# Patient Record
Sex: Female | Born: 1960
Health system: Southern US, Community
[De-identification: ages and names within clinical notes are randomized; demographics above are authoritative.]

## PROBLEM LIST (undated history)

## (undated) DIAGNOSIS — E669 Obesity, unspecified: Secondary | ICD-10-CM

## (undated) DIAGNOSIS — K219 Gastro-esophageal reflux disease without esophagitis: Secondary | ICD-10-CM

## (undated) DIAGNOSIS — R87619 Unspecified abnormal cytological findings in specimens from cervix uteri: Secondary | ICD-10-CM

## (undated) DIAGNOSIS — R7301 Impaired fasting glucose: Secondary | ICD-10-CM

## (undated) DIAGNOSIS — R7303 Prediabetes: Secondary | ICD-10-CM

## (undated) DIAGNOSIS — I10 Essential (primary) hypertension: Secondary | ICD-10-CM

## (undated) HISTORY — DX: Gastro-esophageal reflux disease without esophagitis: K21.9

## (undated) HISTORY — DX: Unspecified abnormal cytological findings in specimens from cervix uteri: R87.619

## (undated) HISTORY — PX: APPENDECTOMY: SHX54

## (undated) HISTORY — DX: Obesity, unspecified: E66.9

## (undated) HISTORY — DX: Prediabetes: R73.03

## (undated) HISTORY — DX: Impaired fasting glucose: R73.01

## (undated) HISTORY — DX: Essential (primary) hypertension: I10

## (undated) HISTORY — PX: OTHER SURGICAL HISTORY: SHX169

---

## 2006-09-19 ENCOUNTER — Ambulatory Visit: Payer: Self-pay | Admitting: Family Medicine

## 2006-09-19 ENCOUNTER — Encounter: Payer: Self-pay | Admitting: Family Medicine

## 2006-09-19 DIAGNOSIS — I10 Essential (primary) hypertension: Secondary | ICD-10-CM | POA: Insufficient documentation

## 2006-09-19 DIAGNOSIS — K219 Gastro-esophageal reflux disease without esophagitis: Secondary | ICD-10-CM | POA: Insufficient documentation

## 2006-10-02 ENCOUNTER — Ambulatory Visit: Payer: Self-pay | Admitting: Family Medicine

## 2007-01-11 ENCOUNTER — Ambulatory Visit: Payer: Self-pay | Admitting: Family Medicine

## 2007-04-08 ENCOUNTER — Telehealth: Payer: Self-pay | Admitting: Family Medicine

## 2007-10-14 ENCOUNTER — Ambulatory Visit: Payer: Self-pay | Admitting: Family Medicine

## 2007-12-03 ENCOUNTER — Encounter: Payer: Self-pay | Admitting: Family Medicine

## 2007-12-03 LAB — CONVERTED CEMR LAB
Albumin: 4.6 g/dL (ref 3.5–5.2)
Alkaline Phosphatase: 75 units/L (ref 39–117)
BUN: 10 mg/dL (ref 6–23)
CO2: 24 meq/L (ref 19–32)
Calcium: 9.3 mg/dL (ref 8.4–10.5)
Chloride: 101 meq/L (ref 96–112)
Cholesterol: 199 mg/dL (ref 0–200)
Glucose, Bld: 111 mg/dL — ABNORMAL HIGH (ref 70–99)
HDL: 62 mg/dL (ref >39)
LDL Cholesterol: 121 mg/dL — ABNORMAL HIGH (ref 0–99)
Potassium: 4 meq/L (ref 3.5–5.3)
Sodium: 138 meq/L (ref 135–145)
Total Protein: 7.8 g/dL (ref 6.0–8.3)
Triglycerides: 82 mg/dL (ref <150)

## 2007-12-04 ENCOUNTER — Encounter: Payer: Self-pay | Admitting: Family Medicine

## 2007-12-24 ENCOUNTER — Ambulatory Visit: Payer: Self-pay | Admitting: Family Medicine

## 2008-03-18 ENCOUNTER — Ambulatory Visit: Payer: Self-pay | Admitting: Family Medicine

## 2008-03-18 ENCOUNTER — Encounter: Admission: RE | Admit: 2008-03-18 | Discharge: 2008-03-18 | Payer: Self-pay | Admitting: Family Medicine

## 2008-03-18 DIAGNOSIS — M5416 Radiculopathy, lumbar region: Secondary | ICD-10-CM | POA: Insufficient documentation

## 2008-03-18 DIAGNOSIS — R7301 Impaired fasting glucose: Secondary | ICD-10-CM | POA: Insufficient documentation

## 2009-01-15 ENCOUNTER — Telehealth: Payer: Self-pay | Admitting: Family Medicine

## 2009-01-19 ENCOUNTER — Ambulatory Visit: Payer: Self-pay | Admitting: Family Medicine

## 2009-02-02 ENCOUNTER — Encounter: Payer: Self-pay | Admitting: Family Medicine

## 2009-02-02 LAB — CONVERTED CEMR LAB
Albumin: 4.6 g/dL (ref 3.5–5.2)
Alkaline Phosphatase: 74 units/L (ref 39–117)
BUN: 15 mg/dL (ref 6–23)
Calcium: 9.7 mg/dL (ref 8.4–10.5)
Chloride: 99 meq/L (ref 96–112)
Glucose, Bld: 98 mg/dL (ref 70–99)
HDL: 58 mg/dL (ref >39)
LDL Cholesterol: 120 mg/dL — ABNORMAL HIGH (ref 0–99)
Potassium: 4.1 meq/L (ref 3.5–5.3)
Sodium: 139 meq/L (ref 135–145)
Total Protein: 7.7 g/dL (ref 6.0–8.3)
Triglycerides: 83 mg/dL (ref <150)

## 2009-02-03 ENCOUNTER — Encounter: Payer: Self-pay | Admitting: Family Medicine

## 2009-04-02 ENCOUNTER — Ambulatory Visit: Payer: Self-pay | Admitting: Family Medicine

## 2009-04-02 DIAGNOSIS — L919 Hypertrophic disorder of the skin, unspecified: Secondary | ICD-10-CM

## 2009-04-02 DIAGNOSIS — L909 Atrophic disorder of skin, unspecified: Secondary | ICD-10-CM | POA: Insufficient documentation

## 2009-10-07 ENCOUNTER — Telehealth (INDEPENDENT_AMBULATORY_CARE_PROVIDER_SITE_OTHER): Payer: Self-pay | Admitting: *Deleted

## 2009-10-13 ENCOUNTER — Ambulatory Visit: Payer: Self-pay | Admitting: Family Medicine

## 2009-12-03 ENCOUNTER — Telehealth: Payer: Self-pay | Admitting: Family Medicine

## 2010-07-05 ENCOUNTER — Ambulatory Visit: Payer: Self-pay | Admitting: Family Medicine

## 2010-09-09 ENCOUNTER — Encounter: Payer: Self-pay | Admitting: Family Medicine

## 2010-09-12 LAB — CONVERTED CEMR LAB
ALT: 15 units/L (ref 0–35)
CO2: 26 meq/L (ref 19–32)
Calcium: 8.7 mg/dL (ref 8.4–10.5)
Chloride: 105 meq/L (ref 96–112)
Cholesterol: 165 mg/dL (ref 0–200)
Creatinine, Ser: 0.73 mg/dL (ref 0.40–1.20)
Glucose, Bld: 91 mg/dL (ref 70–99)
Sodium: 139 meq/L (ref 135–145)
Total Bilirubin: 0.6 mg/dL (ref 0.3–1.2)
Total Protein: 6.6 g/dL (ref 6.0–8.3)
Triglycerides: 58 mg/dL (ref <150)

## 2010-09-21 ENCOUNTER — Encounter: Admission: RE | Admit: 2010-09-21 | Discharge: 2010-09-21 | Payer: Self-pay | Admitting: Family Medicine

## 2010-12-11 ENCOUNTER — Encounter: Payer: Self-pay | Admitting: Family Medicine

## 2010-12-20 NOTE — Progress Notes (Signed)
Summary: OTC cold med  Phone Note Call from Patient   Caller: Patient [phone] Summary of Call: Pt would like to which OTC cold med you recommend since she has HTN. Please advise.  Initial call taken by: Payton Spark CMA,  December 03, 2009 12:24 PM  Follow-up for Phone Call        She can take plain Mucinex + nasal afrin spray every 12 hrs.   Follow-up by: Seymour Bars DO,  December 03, 2009 12:26 PM     Appended Document: OTC cold med [facility] informing Pt of the above.

## 2010-12-20 NOTE — Assessment & Plan Note (Signed)
Summary: CPE w/o pap   Vital Signs:  Patient profile:   50 year old female Height:      65 inches Weight:      147 pounds BMI:     24.55 O2 Sat:      99 % on Room air Pulse rate:   82 / minute BP sitting:   148 / 87  (left arm) Cuff size:   regular  Vitals Entered By: Payton Spark CMA (July 05, 2010 1:08 PM)  O2 Flow:  Room air CC: CPE w/out pap   Primary Care Provider:  Seymour Bars DO  CC:  CPE w/out pap.  History of Present Illness: Amanda Andrews presents for CPE w/o pap smear.  She recently got separated from her husband.  She is taking her Norvasc in the evenings for HTN.  She is walking more, eating healthy and has lost 7 lbs.  She is due for a mammogram and fasting labs.  She has a fam hx of premature stroke.  She is not a smoker.  Denies CP or DOE.  Sees [facility] pap smears.  Denies fam hx of colon cancer.       Current Medications (verified): 1)  Norvasc 5 Mg Tabs (Amlodipine Besylate) .Marland Kitchen.. 1 Tab By Mouth Daily  Allergies (verified): No Known Drug Allergies  Past History:  Past Medical History: Reviewed history from 03/18/2008 and no changes required. HTN GERD obesity impaired fasting glucose  paps: [facility] G2P2  Family History: Reviewed history from 01/19/2009 and no changes required. Father HTN, ETOH mother died , stroke at 36, lung cancer at 21 2 brothers, HTN 1/2 sister healthy Grandparents/Aunts with DM M uncle- 99s.  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   with 2 kids.  Nonsmoker, + 2nd hand smoke.  Walks 30 min 2x a wk.  Wants to lose wt.  Moved here from [place] in 2005.  Review of Systems  The patient denies anorexia, fever, weight loss, weight gain, vision loss, decreased hearing, hoarseness, chest pain, syncope, dyspnea on exertion, peripheral edema, prolonged cough, headaches, hemoptysis, abdominal pain, melena, hematochezia, severe indigestion/heartburn, hematuria, incontinence, genital sores, muscle  weakness, suspicious skin lesions, transient blindness, difficulty walking, depression, unusual weight change, abnormal bleeding, enlarged lymph nodes, angioedema, breast masses, and testicular masses.    Physical Exam  General:  alert, well-developed, well-nourished, and well-hydrated.   Head:  normocephalic and atraumatic.   Eyes:  pupils equal, pupils round, and pupils reactive to light.   Ears:  EACs patent; TMs translucent and gray with good cone of light and bony landmarks.  Nose:  no nasal discharge.   Mouth:  good dentition and pharynx pink and moist.   Neck:  no masses.   Lungs:  Normal respiratory effort, chest expands symmetrically. Lungs are clear to auscultation, no crackles or wheezes. Heart:  Normal rate and regular rhythm. S1 and S2 normal without gallop, murmur, click, rub or other extra sounds. Abdomen:  Bowel sounds positive,abdomen soft and non-tender without masses, organomegaly or hernias noted. Pulses:  2+ radial and pedal pulses Extremities:  no LE edema Skin:  color normal and no suspicious lesions.   Cervical Nodes:  No lymphadenopathy noted Psych:  good eye contact, not anxious appearing, and not depressed appearing.     Impression & Recommendations:  Problem # 1:  HEALTHY ADULT FEMALE (ICD-V70.0) Keeping healthy checklist for women reviewed. BP high on Norvasc 5 mg/ day.  Add HCTZ 12.5 mg/ day.  Update mammogram and fasting labs. EKG done today, NSR at 76 bpm, normal axis, no ischemia. Colonoscoy due at 50.   Tetanus updated 2010. MVI, calcium with D, healthy diet and regular exercise recommended. f/u BP in 2 mos.  Complete Medication List: 1)  Norvasc 5 Mg Tabs (Amlodipine besylate) .Marland Kitchen.. 1 tab by mouth daily 2)  Hydrochlorothiazide 12.5 Mg Caps (Hydrochlorothiazide) .Marland Kitchen.. 1 capsule by mouth qam  Other Orders: T-Comprehensive Metabolic Panel [id number]) T-Lipid Profile ([id number]) T-Mammography Bilateral Screening (29528) EKG w/ Interpretation  (93000)  Patient Instructions: 1)  EKG normal. 2)  Update fasting labs and mammogram. 3)  Will call you w/ results. 4)  Add HCTZ to Norvasc for BP each day. 5)  Return for f/u HTN in 2 mos. Prescriptions: NORVASC 5 MG TABS (AMLODIPINE BESYLATE) 1 tab by mouth daily  #30 x 6   Entered and Authorized by:   Seymour Bars DO   Signed by:   Seymour Bars DO on 07/05/2010   Method used:   Electronically to        [facility]  [address] (retail)       [address]       [place], [place]  [address]       Ph: [phone]       Fax: [phone]   RxID:   [id number] HYDROCHLOROTHIAZIDE 12.5 MG CAPS (HYDROCHLOROTHIAZIDE) 1 capsule by mouth qAM  #30 x 2   Entered and Authorized by:   Seymour Bars DO   Signed by:   Seymour Bars DO on 07/05/2010   Method used:   Electronically to        [facility]  [address] (retail)       [address]       [place], [place]  [address]       Ph: [phone]       Fax: [phone]   RxID:   [id number]

## 2011-06-27 ENCOUNTER — Other Ambulatory Visit: Payer: Self-pay | Admitting: Family Medicine

## 2011-07-21 ENCOUNTER — Encounter: Payer: Self-pay | Admitting: Family Medicine

## 2011-07-24 ENCOUNTER — Encounter: Payer: Self-pay | Admitting: Family Medicine

## 2011-08-01 ENCOUNTER — Encounter: Payer: Self-pay | Admitting: Family Medicine

## 2011-08-01 DIAGNOSIS — Z0289 Encounter for other administrative examinations: Secondary | ICD-10-CM

## 2011-08-09 ENCOUNTER — Telehealth: Payer: Self-pay | Admitting: Family Medicine

## 2011-08-09 MED ORDER — HYDROCHLOROTHIAZIDE 12.5 MG PO CAPS: 12.5000 mg | ORAL_CAPSULE | Freq: Every day | ORAL | Status: DC

## 2011-08-09 MED ORDER — AMLODIPINE BESYLATE 5 MG PO TABS: 5.0000 mg | ORAL_TABLET | Freq: Every day | ORAL | Status: DC

## 2011-08-09 NOTE — Telephone Encounter (Signed)
Patient called left a voice mail she needs high blood pressure meds called into [facility]. Pt states she doesn't have any refills left. Patient request a call back today please

## 2011-08-12 ENCOUNTER — Encounter: Payer: Self-pay | Admitting: Emergency Medicine

## 2011-08-12 ENCOUNTER — Inpatient Hospital Stay (INDEPENDENT_AMBULATORY_CARE_PROVIDER_SITE_OTHER)
Admission: RE | Admit: 2011-08-12 | Discharge: 2011-08-12 | Disposition: A | Discharge status: Home / Self Care | Payer: Self-pay | Source: Ambulatory Visit | Attending: Emergency Medicine | Admitting: Emergency Medicine

## 2011-08-12 DIAGNOSIS — J069 Acute upper respiratory infection, unspecified: Secondary | ICD-10-CM

## 2011-08-12 LAB — CONVERTED CEMR LAB: Rapid Strep: NEGATIVE

## 2011-08-15 ENCOUNTER — Telehealth: Payer: Self-pay | Admitting: Family Medicine

## 2011-08-15 NOTE — Telephone Encounter (Signed)
[facility] sent medication adherence therapy advisory notice.   Wanted correspondence to know if pt still taking amlodipine besylate 5 mg.   Plan  Reviewed.  Patient is still current with the script and [facility] notified last refill sent on 08-09-11. Jarvis Newcomer, LPN [facility]

## 2011-09-14 ENCOUNTER — Other Ambulatory Visit: Payer: Self-pay | Admitting: Family Medicine

## 2011-10-23 ENCOUNTER — Other Ambulatory Visit: Payer: Self-pay | Admitting: Family Medicine

## 2011-10-23 NOTE — Progress Notes (Signed)
Summary: cold?/TM   Vital Signs:  Patient Profile:   50 Years Old Female CC:      sore throat, rash on chest Height:     65 inches Weight:      148 pounds O2 Sat:      98 % O2 treatment:    Room Air Temp:     98.7 degrees F oral Pulse rate:   80 / minute Resp:     16 per minute BP sitting:   152 / 97  (left arm) Cuff size:   large  Vitals Entered By: Linton Flemings RN (August 12, 2011 1:32 PM)                  Updated Prior Medication List: NORVASC 5 MG TABS (AMLODIPINE BESYLATE) 1 tab by mouth daily  Current Allergies (reviewed today): No known allergies History of Present Illness Chief Complaint: sore throat, rash on chest History of Present Illness: 50 Years Old Female complains of onset of cold symptoms for 2 weeks.  Amanda Andrews has been using Alkaseltzer+ which is helping a little bit. +/- sore throat No cough No pleuritic pain No wheezing + nasal congestion + post-nasal drainage No sinus pain/pressure No chest congestion No itchy/red eyes No earache No hemoptysis No SOB No chills/sweats No fever No nausea No vomiting + mild abdominal pain No diarrhea + skin rashes (front of chest) + fatigue No myalgias No headache   REVIEW OF SYSTEMS Constitutional Symptoms       Complains of fever and fatigue.     Denies chills, night sweats, weight loss, and weight gain.  Eyes       Complains of glasses.      Denies change in vision, eye pain, eye discharge, contact lenses, and eye surgery. Ear/Nose/Throat/Mouth       Complains of sore throat.      Denies hearing loss/aids, change in hearing, ear pain, ear discharge, dizziness, frequent runny nose, frequent nose bleeds, sinus problems, hoarseness, and tooth pain or bleeding.  Respiratory       Denies dry cough, productive cough, wheezing, shortness of breath, asthma, bronchitis, and emphysema/COPD.  Cardiovascular       Complains of chest pain and tires easily with exhertion.      Denies murmurs.     Gastrointestinal       Complains of stomach pain.      Denies nausea/vomiting, diarrhea, constipation, blood in bowel movements, and indigestion. Genitourniary       Denies painful urination, kidney stones, and loss of urinary control. Neurological       Complains of headaches.      Denies paralysis, seizures, and fainting/blackouts. Musculoskeletal       Complains of muscle pain and joint pain.      Denies joint stiffness, decreased range of motion, redness, swelling, muscle weakness, and gout.  Skin       Denies bruising, unusual mles/lumps or sores, and hair/skin or nail changes.  Psych       Denies mood changes, temper/anger issues, anxiety/stress, speech problems, depression, and sleep problems. Other Comments: states she did not take BP meds this morning. sore throat n congestion x2 weeks.   Past History:  Past Medical History: Reviewed history from 03/18/2008 and no changes required. HTN GERD obesity impaired fasting glucose  paps: [facility] G2P2  Past Surgical History: Reviewed history from 09/19/2006 and no changes required. appendectomy Removal of cysts on fallopian tube  Family History: Reviewed history from 01/19/2009  and no changes required. Father HTN, ETOH mother died , stroke at 36, lung cancer at 82 2 brothers, HTN 1/2 sister healthy Grandparents/Aunts with DM M uncle- 80s.  Social History: Reviewed history from 07/05/2010 and no changes required. 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   with 2 kids.  Nonsmoker, + 2nd hand smoke.  Walks 30 min 2x a wk.  Wants to lose wt.  Moved here from [place] in 2005. Physical Exam General appearance: well developed, well nourished, no acute distress Ears: normal, no lesions or deformities Nasal: mucosa pink, nonedematous, no septal deviation, turbinates normal Oral/Pharynx: tongue normal, posterior pharynx without erythema or exudate Chest/Lungs: no rales, wheezes, or rhonchi bilateral, breath sounds equal  without effort Heart: regular rate and  rhythm, no murmur Abdomen: soft, non-tender without obvious organomegaly Skin: very slight raised erythema on anterior chest c/w atopic derm MSE: oriented to time, place, and person Assessment New Problems: UPPER RESPIRATORY INFECTION, ACUTE (ICD-465.9)   Plan New Orders: New Patient Level II [99202] T-Culture, Throat [[id number]] Rapid Strep [52841] Planning Comments:   1)  No ABX given today.  Rapid strep neg.  Culture pending.  This is likely viral.  For rash, would suggest Cortisone.  If worsening rash or new symptoms, should f/u with her PCP.   2)  Use nasal saline solution (over the counter) at least 3 times a day. 3)  Use over the counter decongestants like Zyrtec-D every 12 hours as needed to help with congestion. 4)  Can take tylenol every 6 hours or motrin every 8 hours for pain or fever. 5)  Follow up with your primary doctor  if no improvement in 5-7 days, sooner if increasing pain, fever, or new symptoms.    The patient and/or caregiver has been counseled thoroughly with regard to medications prescribed including dosage, schedule, interactions, rationale for use, and possible side effects and they verbalize understanding.  Diagnoses and expected course of recovery discussed and will return if not improved as expected or if the condition worsens. Patient and/or caregiver verbalized understanding.   Orders Added: 1)  New Patient Level II [99202] 2)  T-Culture, Throat [[id number]] 3)  Rapid Strep [53664]    Laboratory Results  Date/Time Received: August 12, 2011 1:48 PM  Date/Time Reported: August 12, 2011 1:48 PM   Other Tests  Rapid Strep: negative  Kit Test Internal QC: Negative   (Normal Range: Negative)

## 2011-12-04 ENCOUNTER — Other Ambulatory Visit: Payer: Self-pay | Admitting: Family Medicine

## 2012-02-09 ENCOUNTER — Ambulatory Visit (INDEPENDENT_AMBULATORY_CARE_PROVIDER_SITE_OTHER): Payer: BC Managed Care – PPO | Admitting: Family Medicine

## 2012-02-09 ENCOUNTER — Encounter: Payer: Self-pay | Admitting: Family Medicine

## 2012-02-09 VITALS — BP 152/85 | HR 98 | Ht 66.0 in | Wt 162.0 lb

## 2012-02-09 DIAGNOSIS — M25569 Pain in unspecified knee: Secondary | ICD-10-CM

## 2012-02-09 DIAGNOSIS — Z1231 Encounter for screening mammogram for malignant neoplasm of breast: Secondary | ICD-10-CM

## 2012-02-09 DIAGNOSIS — I10 Essential (primary) hypertension: Secondary | ICD-10-CM

## 2012-02-09 MED ORDER — LISINOPRIL 20 MG PO TABS: 20.0000 mg | ORAL_TABLET | Freq: Every day | ORAL | Status: DC

## 2012-02-09 NOTE — Patient Instructions (Signed)

## 2012-02-09 NOTE — Progress Notes (Signed)
  Subjective:    Patient ID: Amanda Andrews, female    DOB: 1961/10/02, 51 y.o.   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  Says swoll immediately and couldn't walk on it the next day.  [place] ED next day.  Had a normal xray.  She has been wearing brace.  Can' t see ortho until Monday.  Out of work for one week.  She still can't fully extend it. Has been icing it.  She is having pain medially in a hoarse-shoe shape. Will have occ sharp shooting pain.  Given some pain meds for a couple of times. Has been using 800mg  IBU bid.    HTN - No CP or SOB. Taking amlodipine regualry. She felt the hctz caused swelling so didn't take it anymore.   Review of Systems     Objective:   Physical Exam  Constitutional: She is oriented to person, place, and time. She appears well-developed and well-nourished.  HENT:  Head: Normocephalic and atraumatic.  Cardiovascular: Normal rate, regular rhythm and normal heart sounds.   Pulmonary/Chest: Effort normal and breath sounds normal.  Neurological: She is alert and oriented to person, place, and time.  Skin: Skin is warm and dry.  Psychiatric: She has a normal mood and affect. Her behavior is normal.          Assessment & Plan:  Knee Pain - Cointinue IBU for pain. Has follow up with ortho on Monday. Extended her work note.  May be a ligament tear vs cartilage tear.    HTN- BP is high.  Will add lisinopril 20mg  in addition to her amlodipine. F/u in 6 weeks. Due for labs. Given lab slip.    Due for screening mammo. Ok to schedule  Also discussed need for screening colonoscopy. She will think about it. Wants to get her knee addressed first.

## 2012-02-16 ENCOUNTER — Other Ambulatory Visit: Payer: Self-pay | Admitting: Family Medicine

## 2012-02-20 ENCOUNTER — Ambulatory Visit
Admission: RE | Admit: 2012-02-20 | Discharge: 2012-02-20 | Disposition: A | Discharge status: Home / Self Care | Payer: BC Managed Care – PPO | Source: Ambulatory Visit | Attending: Family Medicine | Admitting: Family Medicine

## 2012-02-20 DIAGNOSIS — Z1231 Encounter for screening mammogram for malignant neoplasm of breast: Secondary | ICD-10-CM

## 2012-06-18 ENCOUNTER — Other Ambulatory Visit: Payer: Self-pay | Admitting: Family Medicine

## 2012-09-03 ENCOUNTER — Other Ambulatory Visit: Payer: Self-pay | Admitting: Family Medicine

## 2012-09-03 NOTE — Telephone Encounter (Signed)
Needs appointment

## 2012-11-06 ENCOUNTER — Other Ambulatory Visit: Payer: Self-pay | Admitting: Family Medicine

## 2012-11-06 NOTE — Telephone Encounter (Signed)
Must make appt

## 2012-12-31 ENCOUNTER — Other Ambulatory Visit: Payer: Self-pay | Admitting: Family Medicine

## 2013-01-08 ENCOUNTER — Other Ambulatory Visit: Payer: Self-pay

## 2013-01-10 ENCOUNTER — Other Ambulatory Visit: Payer: Self-pay | Admitting: Family Medicine

## 2013-01-12 ENCOUNTER — Other Ambulatory Visit: Payer: Self-pay | Admitting: Family Medicine

## 2013-01-13 ENCOUNTER — Other Ambulatory Visit: Payer: Self-pay | Admitting: *Deleted

## 2013-01-13 MED ORDER — AMLODIPINE BESYLATE 5 MG PO TABS: ORAL_TABLET | ORAL | Status: DC

## 2013-01-13 MED ORDER — LISINOPRIL 20 MG PO TABS: 20.0000 mg | ORAL_TABLET | Freq: Every day | ORAL | Status: DC

## 2013-03-17 ENCOUNTER — Other Ambulatory Visit: Payer: Self-pay | Admitting: Family Medicine

## 2013-03-17 DIAGNOSIS — Z1231 Encounter for screening mammogram for malignant neoplasm of breast: Secondary | ICD-10-CM

## 2013-03-31 ENCOUNTER — Ambulatory Visit (HOSPITAL_BASED_OUTPATIENT_CLINIC_OR_DEPARTMENT_OTHER): Payer: BC Managed Care – PPO

## 2013-04-18 ENCOUNTER — Telehealth: Payer: Self-pay | Admitting: *Deleted

## 2013-04-18 MED ORDER — AMLODIPINE BESYLATE 5 MG PO TABS: ORAL_TABLET | ORAL | Status: DC

## 2013-04-18 NOTE — Telephone Encounter (Signed)
Left detailed message on vm.

## 2013-04-18 NOTE — Telephone Encounter (Signed)
Pt called yesterday asking for a refill on her bp med.  I returned her call & [facility] stating that she needed an appt.  She left a message this morning asking if you would give her "just a few" until she can make it into the office.  Please advise

## 2013-04-18 NOTE — Telephone Encounter (Signed)
Tell her, I sent over 10 tabs. So please try to get in within the next week and a half.

## 2013-04-21 ENCOUNTER — Ambulatory Visit (INDEPENDENT_AMBULATORY_CARE_PROVIDER_SITE_OTHER): Payer: BC Managed Care – PPO | Admitting: Family Medicine

## 2013-04-21 ENCOUNTER — Encounter: Payer: Self-pay | Admitting: Family Medicine

## 2013-04-21 VITALS — BP 149/88 | HR 82 | Ht 65.0 in | Wt 167.0 lb

## 2013-04-21 DIAGNOSIS — I10 Essential (primary) hypertension: Secondary | ICD-10-CM

## 2013-04-21 MED ORDER — LISINOPRIL-HYDROCHLOROTHIAZIDE 20-25 MG PO TABS: 1.0000 | ORAL_TABLET | Freq: Every day | ORAL | Status: DC

## 2013-04-21 NOTE — Progress Notes (Signed)
  Subjective:    Patient ID: Amanda Andrews, female    DOB: 1961/09/07, 52 y.o.   MRN: [id number]  HPI  Hypotension-here to followup for blood pressure. She was seen approximately a year ago and never followed up. She was on amlodipine and was poorly controlled at that time so we have added lisinopril. She says she thinks it may have made her ankle swelling worse but really can't remember. She did take it for about a month. When she ran out she did not come back in. She denies any chest pain or short of breath. She does admit that she probably needs to work on losing a little bit more weight.  Review of Systems     Objective:   Physical Exam  Constitutional: She is oriented to person, place, and time. She appears well-developed and well-nourished.  HENT:  Head: Normocephalic and atraumatic.  Neck: Neck supple. No thyromegaly present.  Cardiovascular: Normal rate, regular rhythm and normal heart sounds.   Pulmonary/Chest: Effort normal and breath sounds normal.  Lymphadenopathy:    She has no cervical adenopathy.  Neurological: She is alert and oriented to person, place, and time.  Skin: Skin is warm and dry.  Psychiatric: She has a normal mood and affect. Her behavior is normal.          Assessment & Plan:  Hypertension-uncontrolled she says she really just wants to be on one agent. Initially I had discussed adding hydrochlorothiazide to her amlodipine. She says she was on her glipizide that may be 18 years ago and does not remember having any negative side effects. I explained her that typically lisinopril does not cause any lower extremity swelling but the amlodipine can. Because she wants to stick with a single agent I will change her to lisinopril HCT. I strongly recommended that she follow up in 4-6 weeks so that we can make sure that she feels that her medication and her blood pressure is really well controlled. We will need to check a BMP at that point in time. She's also  overdue for current blood work to make sure that her renal function is normal and that her lipids are normal as well. Given lab slip today. Call if any problems or side effects in the interim.  Impaired fasting glucose-we'll repeat her fasting glucose on this blood work. If it's still elevated then I see her back in followup we can do a hemoglobin A1c.

## 2013-06-02 ENCOUNTER — Encounter: Payer: BC Managed Care – PPO | Admitting: Family Medicine

## 2013-06-02 DIAGNOSIS — Z0289 Encounter for other administrative examinations: Secondary | ICD-10-CM

## 2013-06-16 LAB — COMPLETE METABOLIC PANEL WITH GFR
ALT: 14 U/L (ref 0–35)
Albumin: 4.1 g/dL (ref 3.5–5.2)
CO2: 25 mEq/L (ref 19–32)
Calcium: 8.9 mg/dL (ref 8.4–10.5)
Chloride: 105 mEq/L (ref 96–112)
GFR, Est African American: 86 mL/min
GFR, Est Non African American: 75 mL/min
Glucose, Bld: 92 mg/dL (ref 70–99)
Sodium: 138 mEq/L (ref 135–145)
Total Protein: 6.7 g/dL (ref 6.0–8.3)

## 2013-06-16 LAB — LIPID PANEL: LDL Cholesterol: 94 mg/dL (ref 0–99)

## 2013-06-16 NOTE — Progress Notes (Signed)
Quick Note:  All labs are normal. ______ 

## 2013-06-23 ENCOUNTER — Ambulatory Visit (HOSPITAL_BASED_OUTPATIENT_CLINIC_OR_DEPARTMENT_OTHER)
Admission: RE | Admit: 2013-06-23 | Discharge: 2013-06-23 | Disposition: A | Discharge status: Home / Self Care | Payer: BC Managed Care – PPO | Source: Ambulatory Visit | Attending: Family Medicine | Admitting: Family Medicine

## 2013-06-23 DIAGNOSIS — Z1231 Encounter for screening mammogram for malignant neoplasm of breast: Secondary | ICD-10-CM | POA: Insufficient documentation

## 2013-06-24 ENCOUNTER — Other Ambulatory Visit: Payer: Self-pay | Admitting: Family Medicine

## 2013-06-24 DIAGNOSIS — R928 Other abnormal and inconclusive findings on diagnostic imaging of breast: Secondary | ICD-10-CM

## 2013-06-26 ENCOUNTER — Ambulatory Visit
Admission: RE | Admit: 2013-06-26 | Discharge: 2013-06-26 | Disposition: A | Discharge status: Home / Self Care | Payer: BC Managed Care – PPO | Source: Ambulatory Visit | Attending: Family Medicine | Admitting: Family Medicine

## 2013-06-26 DIAGNOSIS — R928 Other abnormal and inconclusive findings on diagnostic imaging of breast: Secondary | ICD-10-CM

## 2013-07-09 ENCOUNTER — Encounter: Payer: Self-pay | Admitting: Family Medicine

## 2013-07-09 ENCOUNTER — Ambulatory Visit (INDEPENDENT_AMBULATORY_CARE_PROVIDER_SITE_OTHER): Payer: BC Managed Care – PPO | Admitting: Family Medicine

## 2013-07-09 VITALS — BP 143/92 | HR 107 | Wt 169.0 lb

## 2013-07-09 DIAGNOSIS — S4980XA Other specified injuries of shoulder and upper arm, unspecified arm, initial encounter: Secondary | ICD-10-CM

## 2013-07-09 DIAGNOSIS — S46001A Unspecified injury of muscle(s) and tendon(s) of the rotator cuff of right shoulder, initial encounter: Secondary | ICD-10-CM

## 2013-07-09 DIAGNOSIS — M25519 Pain in unspecified shoulder: Secondary | ICD-10-CM

## 2013-07-09 MED ORDER — DICLOFENAC SODIUM 50 MG PO TBEC: DELAYED_RELEASE_TABLET | ORAL | Status: DC

## 2013-07-09 NOTE — Progress Notes (Signed)
CC: Amanda Andrews is a 52 y.o. female is here for right shoulder pain   Subjective: HPI:  2 days ago patient experienced brief episode of pain in the right anterior shoulder while lifting heavy objects. Since then pain is been worsening a daily basis. Described as absent at rest however any abduction of the humerus causes moderate pain she is unable to internally rotate her humerus putting her hand on the back due to pain if she takes ibuprofen 800 mg she is able to abduct beyond 90 to about 120 otherwise unable to abduct beyond 60.. rolling over on the right shoulder causes her awake at night. She has questionable subjective swelling anteriorly. Pain is located anteriorly and radiates slightly down the lateral aspect of the humerus. There's been no weakness or sensory disturbances or upper extremity nor neck pain. She denies recent or remote trauma to the right shoulder other than above. Denies shortness of breath, wheezing, chest pain, nor skin changes overlying the site of pain. Improves with rest and ibuprofen nothing makes worse other than above present all hours of the day   Review Of Systems Outlined In HPI  Past Medical History  Diagnosis Date  . Hypertension   . GERD (gastroesophageal reflux disease)   . Obesity   . Impaired fasting glucose      Family History  Problem Relation Age of Onset  . Stroke Mother   . Cancer Mother   . Alcohol abuse Father   . Hypertension Father   . Hypertension Brother   . Hypertension Brother   . Diabetes      grandparents, aunts     History  Substance Use Topics  . Smoking status: Never Smoker   . Smokeless tobacco: Not on file  . Alcohol Use: No     Objective: Filed Vitals:   07/09/13 1448  BP: 143/92  Pulse: 107    General: Alert and Oriented, No Acute Distress HEENT: Pupils equal, round, reactive to light. Conjunctivae clear.  Moist mucous membranes Lungs: Clear to auscultation bilaterally, no wheezing/ronchi/rales.   Comfortable work of breathing. Good air movement. Cardiac: Regular rate and rhythm. Normal S1/S2.  No murmurs, rubs, nor gallops.   Extremities: No peripheral edema.  Strong peripheral pulses. Right humerus has 4/5 external rotation 5 out of 5 internal rotation, she is able abduct to 60 actively 90 passively however pain prohibits beyond that. Pain reproduced with internal rotation hand behind back. Negative crossarm test negative speeds test positive Hawkins positive empty cans. She has full grip strength range of motion strength from the elbow distally. No pain with a.c. joint palpation. Mental Status: No depression, anxiety, nor agitation. Skin: Warm and dry. No overlying skin changes at the shoulder right  Assessment & Plan: Amanda Andrews was seen today for right shoulder pain.  Diagnoses and associated orders for this visit:  Injury of tendon of right rotator cuff, initial encounter - diclofenac (VOLTAREN) 50 MG EC tablet; Take one tablet every 8 hours only as needed for pain, take with small snack.    Discussed with patient options including rest and/or oral nonsteroidal anti-inflammatories and/or subacromial steroid injection did my suspicion of rotator cuff tendinopathy, low suspicion of avulsion at this time given her range of motion earlier today while using ibuprofen. 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 Karie Schwalbe sports med if not improved.  Subacromial Shoulder Injection Procedure Note  Pre-operative Diagnosis: Right rotator cuff tendinopathy  Post-operative Diagnosis: Same  Indications: Pain  Anesthesia: Topical aerosol freeze spray  Procedure Details   Verbal consent was obtained for the procedure. The  shoulder was prepped with alcohol and the skin was anesthetized. A 25 gauge needle was advanced into the subacromial space through posterior approach without difficulty  The space was then injected with 3 ml 1% lidocaine and 1 ml of triamcinolone (KENALOG) 40mg /ml. The injection site was cleansed with isopropyl alcohol and a dressing was applied.  Complications:  None; patient tolerated the procedure well.

## 2013-10-20 ENCOUNTER — Other Ambulatory Visit: Payer: Self-pay | Admitting: Family Medicine

## 2013-10-30 ENCOUNTER — Other Ambulatory Visit: Payer: Self-pay | Admitting: Family Medicine

## 2013-10-30 NOTE — Telephone Encounter (Signed)
Needs appt before future refills

## 2014-01-05 ENCOUNTER — Ambulatory Visit (INDEPENDENT_AMBULATORY_CARE_PROVIDER_SITE_OTHER): Payer: BC Managed Care – PPO | Admitting: Family Medicine

## 2014-01-05 ENCOUNTER — Encounter: Payer: Self-pay | Admitting: Family Medicine

## 2014-01-05 VITALS — BP 162/90 | HR 101 | Wt 174.0 lb

## 2014-01-05 DIAGNOSIS — I10 Essential (primary) hypertension: Secondary | ICD-10-CM

## 2014-01-05 DIAGNOSIS — J069 Acute upper respiratory infection, unspecified: Secondary | ICD-10-CM

## 2014-01-05 DIAGNOSIS — Z13220 Encounter for screening for lipoid disorders: Secondary | ICD-10-CM

## 2014-01-05 MED ORDER — LISINOPRIL-HYDROCHLOROTHIAZIDE 20-25 MG PO TABS: 1.0000 | ORAL_TABLET | Freq: Every day | ORAL | Status: DC

## 2014-01-05 NOTE — Progress Notes (Signed)
CC: Amanda Andrews is a 53 y.o. female is here for both eyes red and irritated   Subjective: HPI:  Complains of reddening of both eyes that has been present since Thursday of last week was moderate to severe in severity at onset and has gradually gotten better on a daily basis without any intervention other than not wearing her contacts.  Has been accompanied by nasal congestion, fullness in the ears nausea, and very mild grit discharge from both eyes as an only in the morning. No interventions overall other than sleeping and resting throughout the day. Her employer is worried that she may have something highly contagious.  Denies fevers, chills, shortness of breath, chest pain, rashes, photosensitivity, vision loss, pain with blinking, nor any eye pain whatsoever  Followup essential hypertension: She ran out of her lisinopril-hydrochlorothiazide 2 days ago has not been taking medication since. No outside blood pressures to report. Denies and side effects. Denies chest pain, shortness of breath, angioedema   Review Of Systems Outlined In HPI  Past Medical History  Diagnosis Date  . Hypertension   . GERD (gastroesophageal reflux disease)   . Obesity   . Impaired fasting glucose     Past Surgical History  Procedure Laterality Date  . Appendectomy    . Removal cysts      on fallopian tubes   Family History  Problem Relation Age of Onset  . Stroke Mother   . Cancer Mother   . Alcohol abuse Father   . Hypertension Father   . Hypertension Brother   . Hypertension Brother   . Diabetes      grandparents, aunts    History   Social History  . Marital Status: Married    Spouse Name: N/A    Number of Children: N/A  . Years of Education: N/A   Occupational History  . Not on file.   Social History Main Topics  . Smoking status: Never Smoker   . Smokeless tobacco: Not on file  . Alcohol Use: No  . Drug Use: No  . Sexual Activity:      Comment: separated, 2 kids.   Other  Topics Concern  . Not on file   Social History Narrative  . No narrative on file     Objective: BP 162/90  Pulse 101  Wt 174 lb (78.926 kg)  General: Alert and Oriented, No Acute Distress HEENT: Pupils equal, round, reactive to light. Mild peripheral conjunctival injection bilaterally, anterior chamber appears clear and open bilaterally.  External ears unremarkable, canals clear with intact TMs with appropriate landmarks.  Right middle ear has mild serous effusion left middle ear is open. Pink inferior turbinates.  Moist mucous membranes, pharynx without inflammation nor lesions.  Neck supple without palpable lymphadenopathy nor abnormal masses. Lungs: Clear to auscultation bilaterally, no wheezing/ronchi/rales.  Comfortable work of breathing. Good air movement. Cardiac: Regular rate and rhythm. Normal S1/S2.  No murmurs, rubs, nor gallops.   Mental Status: No depression, anxiety, nor agitation. Skin: Warm and dry.  Assessment & Plan: Amanda Andrews was seen today for both eyes red and irritated.  Diagnoses and associated orders for this visit:  HYPERTENSION, BENIGN ESSENTIAL - BASIC METABOLIC PANEL WITH GFR - lisinopril-hydrochlorothiazide (PRINZIDE,ZESTORETIC) 20-25 MG per tablet; Take 1 tablet by mouth daily.  Lipid screening - Lipid panel  Viral URI    Essential hypertension: Controlled chronic condition restart lisinopril-hydrochlorothiazide, checking renal function when she starts this she is also due for routine dyslipidemia screening which we will  obtain fasting Viral URI: Discussed using Coricidin to help with symptoms are lingering however expect the recovery in the next few days. Discussed the there is no indication for any antibacterial eyedrops redness of her eyes due to the common cold   Return in about 3 months (around 04/04/2014).

## 2014-05-29 ENCOUNTER — Other Ambulatory Visit: Payer: Self-pay | Admitting: Family Medicine

## 2014-05-29 DIAGNOSIS — Z1231 Encounter for screening mammogram for malignant neoplasm of breast: Secondary | ICD-10-CM

## 2014-06-29 ENCOUNTER — Ambulatory Visit (HOSPITAL_BASED_OUTPATIENT_CLINIC_OR_DEPARTMENT_OTHER)
Admission: RE | Admit: 2014-06-29 | Discharge: 2014-06-29 | Disposition: A | Discharge status: Home / Self Care | Payer: BC Managed Care – PPO | Source: Ambulatory Visit | Attending: Family Medicine | Admitting: Family Medicine

## 2014-06-29 DIAGNOSIS — Z1231 Encounter for screening mammogram for malignant neoplasm of breast: Secondary | ICD-10-CM

## 2014-09-07 ENCOUNTER — Other Ambulatory Visit: Payer: Self-pay | Admitting: *Deleted

## 2014-09-07 DIAGNOSIS — I10 Essential (primary) hypertension: Secondary | ICD-10-CM

## 2014-09-07 MED ORDER — LISINOPRIL-HYDROCHLOROTHIAZIDE 20-25 MG PO TABS: 1.0000 | ORAL_TABLET | Freq: Every day | ORAL | Status: DC

## 2014-11-11 ENCOUNTER — Other Ambulatory Visit: Payer: Self-pay | Admitting: Family Medicine

## 2014-11-16 ENCOUNTER — Telehealth: Payer: Self-pay | Admitting: *Deleted

## 2014-11-16 ENCOUNTER — Other Ambulatory Visit: Payer: Self-pay | Admitting: Family Medicine

## 2014-11-16 NOTE — Telephone Encounter (Signed)
Pt called and lvm asking if she would need an appointment for bp med refills. When I called her back I was unable to lvm (no vm setup).Heath GoldBarkley, Caria Transue Lynetta'

## 2015-01-09 ENCOUNTER — Other Ambulatory Visit: Payer: Self-pay | Admitting: Family Medicine

## 2015-01-11 ENCOUNTER — Encounter: Payer: Self-pay | Admitting: Physician Assistant

## 2015-01-11 ENCOUNTER — Ambulatory Visit (INDEPENDENT_AMBULATORY_CARE_PROVIDER_SITE_OTHER): Payer: BLUE CROSS/BLUE SHIELD | Admitting: Physician Assistant

## 2015-01-11 VITALS — BP 116/74 | HR 83 | Temp 98.2°F | Ht 65.0 in | Wt 181.0 lb

## 2015-01-11 DIAGNOSIS — Z13220 Encounter for screening for lipoid disorders: Secondary | ICD-10-CM

## 2015-01-11 DIAGNOSIS — I10 Essential (primary) hypertension: Secondary | ICD-10-CM | POA: Diagnosis not present

## 2015-01-11 DIAGNOSIS — Z131 Encounter for screening for diabetes mellitus: Secondary | ICD-10-CM

## 2015-01-11 DIAGNOSIS — J209 Acute bronchitis, unspecified: Secondary | ICD-10-CM

## 2015-01-11 DIAGNOSIS — R12 Heartburn: Secondary | ICD-10-CM

## 2015-01-11 MED ORDER — LISINOPRIL-HYDROCHLOROTHIAZIDE 20-25 MG PO TABS: ORAL_TABLET | ORAL | Status: DC

## 2015-01-11 MED ORDER — OMEPRAZOLE 40 MG PO CPDR: 40.0000 mg | DELAYED_RELEASE_CAPSULE | Freq: Every day | ORAL | Status: DC

## 2015-01-11 MED ORDER — HYDROCODONE-HOMATROPINE 5-1.5 MG/5ML PO SYRP: 5.0000 mL | ORAL_SOLUTION | Freq: Every evening | ORAL | Status: DC | PRN

## 2015-01-11 MED ORDER — AZITHROMYCIN 250 MG PO TABS: ORAL_TABLET | ORAL | Status: DC

## 2015-01-11 NOTE — Patient Instructions (Signed)

## 2015-01-11 NOTE — Progress Notes (Signed)
   Subjective:    Patient ID: Amanda Andrews, female    DOB: 01/20/1961, 54 y.o.   MRN: [id number]  HPI Patient is a 54 year old female who presents to the clinic with one month of productive cough and some upper respiratory symptoms. She has had on and off nasal congestion, ear pain and sore throat. Most that seems to resolve except for her cough. She's tried over-the-counter NyQuil and DayQuil with little relief. She denies any fever, chills. She does feel like her chest is tight. She didn't lose her voice a couple days ago  She also feels like her heartburn is much worse over the last couple months. She is using a lot more Zantac and Tums. They do help. She has gained 30 pounds in the last 6 months as well.  Hypertension-she is on his side her medication. Her blood pressure is controlled today. She denies any chest pains, palpitations, headaches or vision changes.  Review of Systems  All other systems reviewed and are negative.      Objective:   Physical Exam  Constitutional: She is oriented to person, place, and time. She appears well-developed and well-nourished.  HENT:  Head: Normocephalic and atraumatic.  Right Ear: External ear normal.  Left Ear: External ear normal.  Nose: Nose normal.  Mouth/Throat: Oropharynx is clear and moist.  TMs clear bilaterally.  Negative for any sinus pressure to palpation.  Eyes: Conjunctivae are normal. Right eye exhibits no discharge. Left eye exhibits no discharge.  Neck: Normal range of motion. Neck supple.  Cardiovascular: Normal rate, regular rhythm and normal heart sounds.   Pulmonary/Chest: Effort normal and breath sounds normal. She has no wheezes.  Lymphadenopathy:    She has no cervical adenopathy.  Neurological: She is alert and oriented to person, place, and time.  Skin: Skin is dry.  Psychiatric: She has a normal mood and affect. Her behavior is normal.          Assessment & Plan:   acute bronchitis-symptoms have been  going on for over a month. Patient was treated with azithromycin and Hycodan for cough. Sedation warning given for Hycodan. She was written for work for today. Rest and hydrate. She should be able to go back to work tomorrow. For cough during the day consider Delsym or honey preparations.  Heartburn-Certainly could be exacerbated by weight gain. She would like to talk about weight loss she certainly can make an appointment. I did give her omeprazole 40 mg to start daily for the next month to see if helping with heartburn. If not resolving she does need follow-up.  Hypertension-blood pressure controlled today. Refilled for 6 months. Did get a CMP.  Patient never had fasting labs done at last visit. I reprinted lipid panel. Follow-up with PCP to go over results and complete physical.

## 2015-01-12 LAB — COMPLETE METABOLIC PANEL WITH GFR
ALBUMIN: 4.3 g/dL (ref 3.5–5.2)
ALK PHOS: 66 U/L (ref 39–117)
ALT: 16 U/L (ref 0–35)
AST: 19 U/L (ref 0–37)
BUN: 11 mg/dL (ref 6–23)
CO2: 26 meq/L (ref 19–32)
Calcium: 9.1 mg/dL (ref 8.4–10.5)
Chloride: 99 mEq/L (ref 96–112)
Creat: 0.93 mg/dL (ref 0.50–1.10)
GFR, EST AFRICAN AMERICAN: 81 mL/min
GFR, EST NON AFRICAN AMERICAN: 70 mL/min
GLUCOSE: 94 mg/dL (ref 70–99)
POTASSIUM: 3.9 meq/L (ref 3.5–5.3)
Sodium: 135 mEq/L (ref 135–145)
TOTAL PROTEIN: 7.6 g/dL (ref 6.0–8.3)
Total Bilirubin: 0.3 mg/dL (ref 0.2–1.2)

## 2015-01-12 LAB — LIPID PANEL
Cholesterol: 167 mg/dL (ref 0–200)
HDL: 48 mg/dL (ref >=46)
LDL CALC: 95 mg/dL (ref 0–99)
TRIGLYCERIDES: 121 mg/dL (ref <150)
Total CHOL/HDL Ratio: 3.5 Ratio
VLDL: 24 mg/dL (ref 0–40)

## 2015-01-13 ENCOUNTER — Telehealth: Payer: Self-pay

## 2015-01-13 ENCOUNTER — Other Ambulatory Visit: Payer: Self-pay | Admitting: Physician Assistant

## 2015-01-13 ENCOUNTER — Other Ambulatory Visit: Payer: Self-pay | Admitting: *Deleted

## 2015-01-13 MED ORDER — AMOXICILLIN-POT CLAVULANATE 875-125 MG PO TABS
1.0000 | ORAL_TABLET | Freq: Two times a day (BID) | ORAL | Status: DC
Start: 2015-01-13 — End: 2015-09-20

## 2015-01-13 NOTE — Telephone Encounter (Signed)
Patient called and left a message reporting; rash, vomiting, nausea and sweats. Please advise.

## 2015-01-13 NOTE — Telephone Encounter (Signed)
Sounds like she could be having an allergic reaction. Stop zpak. Can take 25-50mg  of benadryl. Watch for any analphalaxic reaction. i don't see anything on allergic list so far. Can send augmentin for abx coverage for 10 days NRF.

## 2015-01-13 NOTE — Telephone Encounter (Signed)
Called pt and advised her to stop z-pack per provider.  Jennings BooksEbony Brigham,SCMA

## 2015-08-16 ENCOUNTER — Other Ambulatory Visit: Payer: Self-pay | Admitting: Family Medicine

## 2015-08-16 DIAGNOSIS — Z1231 Encounter for screening mammogram for malignant neoplasm of breast: Secondary | ICD-10-CM

## 2015-09-20 ENCOUNTER — Encounter: Payer: Self-pay | Admitting: Physician Assistant

## 2015-09-20 ENCOUNTER — Ambulatory Visit (INDEPENDENT_AMBULATORY_CARE_PROVIDER_SITE_OTHER): Payer: BLUE CROSS/BLUE SHIELD | Admitting: Physician Assistant

## 2015-09-20 VITALS — BP 135/70 | HR 79 | Ht 65.0 in | Wt 195.0 lb

## 2015-09-20 DIAGNOSIS — R600 Localized edema: Secondary | ICD-10-CM | POA: Diagnosis not present

## 2015-09-20 DIAGNOSIS — R635 Abnormal weight gain: Secondary | ICD-10-CM

## 2015-09-20 DIAGNOSIS — E669 Obesity, unspecified: Secondary | ICD-10-CM | POA: Diagnosis not present

## 2015-09-20 DIAGNOSIS — I878 Other specified disorders of veins: Secondary | ICD-10-CM | POA: Diagnosis not present

## 2015-09-20 MED ORDER — PHENTERMINE HCL 37.5 MG PO TABS: 37.5000 mg | ORAL_TABLET | Freq: Every day | ORAL | Status: DC

## 2015-09-20 MED ORDER — FUROSEMIDE 20 MG PO TABS: ORAL_TABLET | ORAL | Status: DC

## 2015-09-20 MED ORDER — OMEPRAZOLE 40 MG PO CPDR: 40.0000 mg | DELAYED_RELEASE_CAPSULE | Freq: Every day | ORAL | Status: DC

## 2015-09-20 MED ORDER — TRIAMCINOLONE 0.1 % CREAM:EUCERIN CREAM 1:1: 1.0000 "application " | TOPICAL_CREAM | Freq: Every day | CUTANEOUS | Status: DC

## 2015-09-20 NOTE — Patient Instructions (Signed)

## 2015-09-22 DIAGNOSIS — I878 Other specified disorders of veins: Secondary | ICD-10-CM | POA: Insufficient documentation

## 2015-09-22 DIAGNOSIS — R600 Localized edema: Secondary | ICD-10-CM | POA: Insufficient documentation

## 2015-09-22 DIAGNOSIS — E669 Obesity, unspecified: Secondary | ICD-10-CM | POA: Insufficient documentation

## 2015-09-22 DIAGNOSIS — R635 Abnormal weight gain: Secondary | ICD-10-CM | POA: Insufficient documentation

## 2015-09-22 NOTE — Progress Notes (Signed)
   Subjective:    Patient ID: Amanda Andrews, female    DOB: 04/04/1961, 54 y.o.   MRN: [id number]  HPI Pt presents to the clinic with bilateral lower leg and ankle edema that has worsened. She has lower leg swelling for the last couple of years off and on. Over past week has gotten worse and now she has a reddish/brownish rash forming around ankles and lower leg. No pain. 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 She was not tried anything to make better. She struggles with weight and seems to be gaining. She does not track calories and is not currently exercising.    Review of Systems  All other systems reviewed and are negative.      Objective:   Physical Exam  Constitutional: She is oriented to person, place, and time. She appears well-developed and well-nourished.  Obese.   HENT:  Head: Normocephalic and atraumatic.  Cardiovascular: Normal rate, regular rhythm and normal heart sounds.   Pulmonary/Chest: Effort normal and breath sounds normal.  Neurological: She is alert and oriented to person, place, and time.  Skin:  Lower leg edema 1+ more focused around ankles bilaterally.   Erythematous to brown rash that is not blanchable over lower legs around ankle   Psychiatric: She has a normal mood and affect. Her behavior is normal.          Assessment & Plan:  Bilateral lower leg edema/venous stasis- pt is on HCTZ in BP medication. Added lasix 20mg  as needed for swelling. Discussed compression stockings. Gave HO on venous stasis.  Encouraged to work on weight loss. See note below. I feel like rash is the hemosiderin rash that can come with edema. Triamcinolone/eurcerin cream sent to use on bilateral legs. Follow up in 4 weeks.   Obesity- BMI 32.45. Started phentermine. Discussed side effects. Pt aware to incorporate diet and exercise. Follow up in 1 month.

## 2015-12-13 ENCOUNTER — Other Ambulatory Visit: Payer: Self-pay | Admitting: Physician Assistant

## 2016-04-27 ENCOUNTER — Other Ambulatory Visit: Payer: Self-pay | Admitting: Physician Assistant

## 2016-05-28 ENCOUNTER — Other Ambulatory Visit: Payer: Self-pay | Admitting: Physician Assistant

## 2016-05-29 ENCOUNTER — Encounter: Payer: BLUE CROSS/BLUE SHIELD | Admitting: Family Medicine

## 2016-06-26 ENCOUNTER — Other Ambulatory Visit: Payer: Self-pay | Admitting: Physician Assistant

## 2016-06-28 ENCOUNTER — Other Ambulatory Visit: Payer: Self-pay | Admitting: Physician Assistant

## 2016-07-03 ENCOUNTER — Other Ambulatory Visit: Payer: Self-pay

## 2016-07-03 MED ORDER — LISINOPRIL-HYDROCHLOROTHIAZIDE 20-25 MG PO TABS: 1.0000 | ORAL_TABLET | Freq: Every day | ORAL | 0 refills | Status: DC

## 2016-07-10 ENCOUNTER — Other Ambulatory Visit: Payer: Self-pay | Admitting: Physician Assistant

## 2016-07-10 ENCOUNTER — Encounter: Payer: Self-pay | Admitting: Physician Assistant

## 2016-07-10 ENCOUNTER — Ambulatory Visit (INDEPENDENT_AMBULATORY_CARE_PROVIDER_SITE_OTHER): Payer: BLUE CROSS/BLUE SHIELD | Admitting: Physician Assistant

## 2016-07-10 VITALS — BP 129/77 | HR 73 | Ht 65.0 in | Wt 194.0 lb

## 2016-07-10 DIAGNOSIS — Z13220 Encounter for screening for lipoid disorders: Secondary | ICD-10-CM

## 2016-07-10 DIAGNOSIS — R140 Abdominal distension (gaseous): Secondary | ICD-10-CM | POA: Insufficient documentation

## 2016-07-10 DIAGNOSIS — I10 Essential (primary) hypertension: Secondary | ICD-10-CM

## 2016-07-10 DIAGNOSIS — R7301 Impaired fasting glucose: Secondary | ICD-10-CM

## 2016-07-10 DIAGNOSIS — Z131 Encounter for screening for diabetes mellitus: Secondary | ICD-10-CM | POA: Diagnosis not present

## 2016-07-10 DIAGNOSIS — R5383 Other fatigue: Secondary | ICD-10-CM | POA: Insufficient documentation

## 2016-07-10 DIAGNOSIS — Z1231 Encounter for screening mammogram for malignant neoplasm of breast: Secondary | ICD-10-CM

## 2016-07-10 DIAGNOSIS — Z1211 Encounter for screening for malignant neoplasm of colon: Secondary | ICD-10-CM

## 2016-07-10 DIAGNOSIS — R635 Abnormal weight gain: Secondary | ICD-10-CM

## 2016-07-10 LAB — LIPID PANEL
CHOLESTEROL: 181 mg/dL (ref 125–200)
HDL: 61 mg/dL (ref >=46)
LDL Cholesterol: 99 mg/dL (ref <130)
TRIGLYCERIDES: 103 mg/dL (ref <150)
Total CHOL/HDL Ratio: 3 Ratio (ref <=5.0)
VLDL: 21 mg/dL (ref <30)

## 2016-07-10 LAB — COMPLETE METABOLIC PANEL WITH GFR
ALT: 21 U/L (ref 6–29)
AST: 17 U/L (ref 10–35)
Albumin: 4.2 g/dL (ref 3.6–5.1)
Alkaline Phosphatase: 70 U/L (ref 33–130)
BILIRUBIN TOTAL: 0.5 mg/dL (ref 0.2–1.2)
BUN: 19 mg/dL (ref 7–25)
CALCIUM: 9.5 mg/dL (ref 8.6–10.4)
CHLORIDE: 101 mmol/L (ref 98–110)
CO2: 28 mmol/L (ref 20–31)
CREATININE: 0.97 mg/dL (ref 0.50–1.05)
GFR, EST AFRICAN AMERICAN: 76 mL/min (ref >=60)
GFR, EST NON AFRICAN AMERICAN: 66 mL/min (ref >=60)
Glucose, Bld: 101 mg/dL — ABNORMAL HIGH (ref 65–99)
Potassium: 4.4 mmol/L (ref 3.5–5.3)
Sodium: 135 mmol/L (ref 135–146)
Total Protein: 6.9 g/dL (ref 6.1–8.1)

## 2016-07-10 LAB — CBC
HCT: 45.4 % — ABNORMAL HIGH (ref 35.0–45.0)
Hemoglobin: 14.1 g/dL (ref 11.7–15.5)
MCH: 29.4 pg (ref 27.0–33.0)
MCHC: 31.1 g/dL — AB (ref 32.0–36.0)
MCV: 94.6 fL (ref 80.0–100.0)
MPV: 10.8 fL (ref 7.5–12.5)
PLATELETS: 285 10*3/uL (ref 140–400)
RBC: 4.8 MIL/uL (ref 3.80–5.10)
RDW: 13.2 % (ref 11.0–15.0)
WBC: 5.4 10*3/uL (ref 3.8–10.8)

## 2016-07-10 LAB — TSH: TSH: 1.68 mIU/L

## 2016-07-10 MED ORDER — LISINOPRIL-HYDROCHLOROTHIAZIDE 20-25 MG PO TABS
1.0000 | ORAL_TABLET | Freq: Every day | ORAL | 4 refills | Status: DC
Start: 2016-07-10 — End: 2017-09-15

## 2016-07-10 NOTE — Progress Notes (Signed)
   Subjective:    Patient ID: Amanda Andrews, female    DOB: 04/22/1961, 55 y.o.   MRN: [id number]  HPI Patient is a 55 year old female who presents to clinic for hypertension follow-up. She needs her medication refill. She has no concerns or complaints regarding her blood pressure. She is taking her medication daily. She denies any chest pains, palpitations, headaches or dizziness.  Patient does request labs for cholesterol and screening for diabetes. She usually has the strong yearly and has been over a year.  Patient is concerned with her weight gain and no energy. She feels like her energy has been decreased this whole year. She feels like she is sleeping 6-7 good hours at night. She admits she wakes up feeling like she's not actually rested. She's noticed a lot of bloating after eating. She's not had anything to make better or worse.   Review of Systems See HPI.     Objective:   Physical Exam  Constitutional: She is oriented to person, place, and time. She appears well-developed and well-nourished.  HENT:  Head: Normocephalic and atraumatic.  Neck: Normal range of motion. Neck supple. No thyromegaly present.  Cardiovascular: Normal rate, regular rhythm and normal heart sounds.   Neurological: She is alert and oriented to person, place, and time.  Skin: Skin is dry.  Psychiatric: She has a normal mood and affect. Her behavior is normal.          Assessment & Plan:  HTN- Controlled today. Refilled blood pressure medicine for 1 year. CMP ordered.  Mammogram/colonoscopy ordered.   Impaired fasting glucose- screening with fasting cmp.   No energy/obesity- discussed weight loss medications. Pt is not on board at this time. Labs order to evaluate fatigue. Encourage 150 minutes of exercise a week. Encouraged a 1500-calorie diet. Patient declines any depression or his body.  Bloating- discussed keeping a food diary to see if related to food. Encouraged probiotic daily. miralax as  needed for constipation. Follow up if not improving with probiotic.

## 2016-07-11 LAB — VITAMIN B12: VITAMIN B 12: 453 pg/mL (ref 200–1100)

## 2016-07-11 LAB — FERRITIN: Ferritin: 30 ng/mL (ref 10–232)

## 2016-07-11 LAB — HEMOGLOBIN A1C
HEMOGLOBIN A1C: 5.3 % (ref <5.7)
Mean Plasma Glucose: 105 mg/dL

## 2016-07-11 LAB — VITAMIN D 25 HYDROXY (VIT D DEFICIENCY, FRACTURES): Vit D, 25-Hydroxy: 35 ng/mL (ref 30–100)

## 2016-07-21 ENCOUNTER — Ambulatory Visit: Payer: BLUE CROSS/BLUE SHIELD

## 2016-08-03 ENCOUNTER — Encounter: Payer: Self-pay | Admitting: Sports Medicine

## 2016-08-03 ENCOUNTER — Ambulatory Visit (INDEPENDENT_AMBULATORY_CARE_PROVIDER_SITE_OTHER): Payer: BLUE CROSS/BLUE SHIELD | Admitting: Sports Medicine

## 2016-08-03 DIAGNOSIS — L237 Allergic contact dermatitis due to plants, except food: Secondary | ICD-10-CM | POA: Diagnosis not present

## 2016-08-03 MED ORDER — PREDNISONE 50 MG PO TABS: 50.0000 mg | ORAL_TABLET | Freq: Every day | ORAL | 0 refills | Status: DC

## 2016-08-03 MED ORDER — TRIAMCINOLONE ACETONIDE 0.5 % EX CREA: 1.0000 "application " | TOPICAL_CREAM | Freq: Two times a day (BID) | CUTANEOUS | 3 refills | Status: DC

## 2016-08-03 NOTE — Progress Notes (Signed)
  Subjective:    CC:  Rash   HPI: Patient is a 55 y.o. Caucasian Female presenting today with complaints of a rash. The patient notes that she went camping over labor day but did not notice the rash until later. The patient believes that it is possibly a bite but did not feel anything bite her. The patient states that the itching did not start right away but it is now severe rated as a 8/10. The patient notes that these itching spots are located on her legs, arms, and now her face and scalp. The patient denies any known contact with allergens, shortness of breath, chest pain, or fever. The patient has been trying over-the-counter cortisone, hydrogen peroxide, aloe vera, poison ivy cream, and wound gel with only some itch relief.   Past medical history, Surgical history, Family history not pertinant except as noted below, Social history, Allergies, and medications have been entered into the medical record, reviewed, and no changes needed.    Review of Systems: No fevers, chills, night sweats, weight loss, congestion, sore throat, rhinorrhea, chest pain, or shortness of breath.   Objective:    General: Well Developed, well nourished, and in no acute distress.  Neuro: Alert and oriented x3, extra-ocular muscles intact, sensation grossly intact.  HEENT: Normocephalic, atraumatic, pupils equal round reactive to light, neck supple, no masses, no lymphadenopathy, thyroid nonpalpable.  Skin: Warm and dry. Numerous erythematous lesions noted on patients legs, arms, and face. Lesions are in a linear fashion with vesicle formation. Cardiac: Regular rate and rhythm, no murmurs rubs or gallops, no lower extremity edema.  Respiratory: Clear to auscultation bilaterally. Not using accessory muscles, speaking in full sentences.   Impression and Recommendations:    Poison ivy dermatitis Prednisone 50 daily and topical triamcinolone, return to see us in 2 weeks   1. Poison ivy dermatitis - Discussed with  patient that are lesions are secondary to poison ivy dermatitis. Patient was advised to keep the area clean and dry. Patient prescribed oral prednisone 50 mg daily and topical triamcinolone. Patient to return-to-clinic in 2 week.

## 2016-08-03 NOTE — Assessment & Plan Note (Signed)
Prednisone 50 daily and topical triamcinolone, return to see us in 2 weeks

## 2016-08-03 NOTE — Patient Instructions (Signed)

## 2016-09-15 ENCOUNTER — Ambulatory Visit (INDEPENDENT_AMBULATORY_CARE_PROVIDER_SITE_OTHER): Payer: BLUE CROSS/BLUE SHIELD | Admitting: Sports Medicine

## 2016-09-15 VITALS — BP 118/72 | HR 89 | Temp 98.6°F | Wt 196.0 lb

## 2016-09-15 DIAGNOSIS — N3000 Acute cystitis without hematuria: Secondary | ICD-10-CM | POA: Insufficient documentation

## 2016-09-15 DIAGNOSIS — R300 Dysuria: Secondary | ICD-10-CM | POA: Diagnosis not present

## 2016-09-15 DIAGNOSIS — N3001 Acute cystitis with hematuria: Secondary | ICD-10-CM | POA: Diagnosis not present

## 2016-09-15 LAB — POCT URINALYSIS DIPSTICK
Bilirubin, UA: NEGATIVE
Glucose, UA: NEGATIVE
Ketones, UA: NEGATIVE
Nitrite, UA: POSITIVE
Protein, UA: NEGATIVE
Spec Grav, UA: <=1.005
Urobilinogen, UA: 0.2
pH, UA: 6

## 2016-09-15 MED ORDER — NITROFURANTOIN MONOHYD MACRO 100 MG PO CAPS: 100.0000 mg | ORAL_CAPSULE | Freq: Two times a day (BID) | ORAL | 0 refills | Status: DC

## 2016-09-15 NOTE — Assessment & Plan Note (Signed)
Macrobid, continue Azo. Awaiting urine culture. Return as needed.

## 2016-09-15 NOTE — Progress Notes (Signed)
   Subjective:    Patient ID: Amanda Andrews, female    DOB: 11/19/1961, 55 y.o.   MRN: [id number]  HPI  Amanda Andrews complains of burning during urination and frequent urination for 2 days. Patient reports no antibiotic use or no recent catheterization. Patient has taken Azo. Denies flank pain, pelvic pain, fever, chills or sweats.    Review of Systems     Objective:   Physical Exam        Assessment & Plan:  Dysuria - U/A positive blood, positive nit and moderate leu. Urine culture pending.

## 2016-09-18 LAB — URINE CULTURE

## 2016-09-19 ENCOUNTER — Telehealth: Payer: Self-pay

## 2016-09-19 NOTE — Telephone Encounter (Signed)
Patient advised of results and recommendations.  

## 2016-09-19 NOTE — Telephone Encounter (Signed)
Patient is calling about urine culture results. Please advise.

## 2016-09-19 NOTE — Telephone Encounter (Signed)
The cultures growing out a bacteria called Staphylococcus saprophyticus, this will respond to Macrobid.

## 2017-03-27 ENCOUNTER — Ambulatory Visit (INDEPENDENT_AMBULATORY_CARE_PROVIDER_SITE_OTHER): Payer: BLUE CROSS/BLUE SHIELD | Admitting: Family Medicine

## 2017-03-27 VITALS — BP 138/81 | HR 79 | Temp 98.4°F | Wt 203.0 lb

## 2017-03-27 DIAGNOSIS — K2100 Gastro-esophageal reflux disease with esophagitis, without bleeding: Secondary | ICD-10-CM

## 2017-03-27 DIAGNOSIS — J069 Acute upper respiratory infection, unspecified: Secondary | ICD-10-CM | POA: Diagnosis not present

## 2017-03-27 MED ORDER — PANTOPRAZOLE SODIUM 40 MG PO TBEC: 40.0000 mg | DELAYED_RELEASE_TABLET | Freq: Every day | ORAL | 3 refills | Status: DC

## 2017-03-27 NOTE — Progress Notes (Signed)
Subjective:    Patient ID: Amanda Andrews, female    DOB: 06-Apr-1961, 56 y.o.   MRN: [id number]  HPI  56 year old female comes in today complaining of 4 days of headache, mild nasal congestion, sore throat, voice hoarseness and laryngitis. No ear pain but the right side of her neck hurts. No fevers chills or sweats. She's mostly been using DayQuil and NyQuil. She's worried she is getting a sinus infection.  She also complains of reflux. She says to the point where she's actually been taking at times almost every single day. Before when she would have heartburn it was specific triggers like pizza and now she feels like every time she eats she is getting heartburn. She was actually on Nexium years ago and that worked really well. She has gained some weight more recently.   Review of Systems  BP 138/81   Pulse 79   Temp 98.4 F (36.9 C) (Oral)   Wt 203 lb (92.1 kg)   BMI 33.78 kg/m     Allergies  Allergen Reactions  . Azithromycin     After starting had nausea, vomiting, rash.     Past Medical History:  Diagnosis Date  . GERD (gastroesophageal reflux disease)   . Hypertension   . Impaired fasting glucose   . Obesity     Past Surgical History:  Procedure Laterality Date  . APPENDECTOMY    . removal cysts     on fallopian tubes    Social History   Social History  . Marital status: Married    Spouse name: N/A  . Number of children: N/A  . Years of education: N/A   Occupational History  . Not on file.   Social History Main Topics  . Smoking status: Never Smoker  . Smokeless tobacco: Not on file  . Alcohol use No  . Drug use: No  . Sexual activity: Not on file     Comment: separated, 2 kids.   Other Topics Concern  . Not on file   Social History Narrative  . No narrative on file    Family History  Problem Relation Age of Onset  . Stroke Mother   . Cancer Mother   . Alcohol abuse Father   . Hypertension Father   . Hypertension Brother   .  Hypertension Brother   . Diabetes      grandparents, aunts    Outpatient Encounter Prescriptions as of 03/27/2017  Medication Sig  . lisinopril-hydrochlorothiazide (PRINZIDE,ZESTORETIC) 20-25 MG tablet Take 1 tablet by mouth daily.  . [DISCONTINUED] nitrofurantoin, macrocrystal-monohydrate, (MACROBID) 100 MG capsule Take 1 capsule (100 mg total) by mouth 2 (two) times daily.  . [DISCONTINUED] phenazopyridine (PYRIDIUM) 97 MG tablet Take 97 mg by mouth 3 (three) times daily as needed for pain.   No facility-administered encounter medications on file as of 03/27/2017.          Objective:   Physical Exam  Constitutional: She is oriented to person, place, and time. She appears well-developed and well-nourished.  HENT:  Head: Normocephalic and atraumatic.  Right Ear: External ear normal.  Left Ear: External ear normal.  Nose: Nose normal.  Mouth/Throat: Oropharynx is clear and moist.  TMs and canals are clear.   Eyes: Conjunctivae and EOM are normal. Pupils are equal, round, and reactive to light.  Neck: Neck supple. No thyromegaly present.  Cardiovascular: Normal rate, regular rhythm and normal heart sounds.   Pulmonary/Chest: Effort normal and breath sounds normal. She has no wheezes.  Lymphadenopathy:    She has no cervical adenopathy.  Neurological: She is alert and oriented to person, place, and time.  Skin: Skin is warm and dry.  Psychiatric: She has a normal mood and affect.          Assessment & Plan:  Upper respiratory infection-likely viral. Recommend continue symptom Medicare. If she's not feeling better by Friday she can call me before the weekend and let me know and I will be happy to call in an antibiotic at that point if she's not improving.  GERD- recommend getting her started on a PPI. Also recommended avoiding trigger foods and reviewed those with her today. Weight loss would certainly help as well. If after couple months in the PPI she is doing well but  encouraged her to wean off. Explained we try to keep him on these long-term if at all possible.

## 2017-03-27 NOTE — Patient Instructions (Addendum)
Upper Respiratory Infection, Adult Most upper respiratory infections (URIs) are caused by a virus. A URI affects the nose, throat, and upper air passages. The most common type of URI is often called "the common cold." Follow these instructions at home:  Take medicines only as told by your doctor.  Gargle warm saltwater or take cough drops to comfort your throat as told by your doctor.  Use a warm mist humidifier or inhale steam from a shower to increase air moisture. This may make it easier to breathe.  Drink enough fluid to keep your pee (urine) clear or pale yellow.  Eat soups and other clear broths.  Have a healthy diet.  Rest as needed.  Go back to work when your fever is gone or your doctor says it is okay.  You may need to stay home longer to avoid giving your URI to others.  You can also wear a face mask and wash your hands often to prevent spread of the virus.  Use your inhaler more if you have asthma.  Do not use any tobacco products, including cigarettes, chewing tobacco, or electronic cigarettes. If you need help quitting, ask your doctor. Contact a doctor if:  You are getting worse, not better.  Your symptoms are not helped by medicine.  You have chills.  You are getting more short of breath.  You have brown or red mucus.  You have yellow or brown discharge from your nose.  You have pain in your face, especially when you bend forward.  You have a fever.  You have puffy (swollen) neck glands.  You have pain while swallowing.  You have white areas in the back of your throat. Get help right away if:  You have very bad or constant:  Headache.  Ear pain.  Pain in your forehead, behind your eyes, and over your cheekbones (sinus pain).  Chest pain.  You have long-lasting (chronic) lung disease and any of the following:  Wheezing.  Long-lasting cough.  Coughing up blood.  A change in your usual mucus.  You have a stiff neck.  You have  changes in your:  Vision.  Hearing.  Thinking.  Mood. This information is not intended to replace advice given to you by your health care provider. Make sure you discuss any questions you have with your health care provider. Document Released: 04/24/2008 Document Revised: 07/09/2016 Document Reviewed: 02/11/2014 [employer] Interactive Patient Education  2017 [employer]  

## 2017-08-11 DIAGNOSIS — R42 Dizziness and giddiness: Secondary | ICD-10-CM | POA: Diagnosis not present

## 2017-08-11 DIAGNOSIS — Z79899 Other long term (current) drug therapy: Secondary | ICD-10-CM | POA: Diagnosis not present

## 2017-08-11 DIAGNOSIS — I10 Essential (primary) hypertension: Secondary | ICD-10-CM | POA: Diagnosis not present

## 2017-08-11 DIAGNOSIS — R1110 Vomiting, unspecified: Secondary | ICD-10-CM | POA: Diagnosis not present

## 2017-08-13 ENCOUNTER — Encounter: Payer: Self-pay | Admitting: Family Medicine

## 2017-08-13 ENCOUNTER — Ambulatory Visit (INDEPENDENT_AMBULATORY_CARE_PROVIDER_SITE_OTHER): Payer: BLUE CROSS/BLUE SHIELD | Admitting: Family Medicine

## 2017-08-13 VITALS — BP 114/73 | HR 83 | Ht 65.0 in | Wt 193.0 lb

## 2017-08-13 DIAGNOSIS — K2100 Gastro-esophageal reflux disease with esophagitis, without bleeding: Secondary | ICD-10-CM

## 2017-08-13 DIAGNOSIS — H8111 Benign paroxysmal vertigo, right ear: Secondary | ICD-10-CM | POA: Diagnosis not present

## 2017-08-13 NOTE — Patient Instructions (Addendum)
Food Choices for Gastroesophageal Reflux Disease, Adult When you have gastroesophageal reflux disease (GERD), the foods you eat and your eating habits are very important. Choosing the right foods can help ease the discomfort of GERD. Consider working with a diet and nutrition specialist (dietitian) to help you make healthy food choices. What general guidelines should I follow? Eating plan  Choose healthy foods low in fat, such as fruits, vegetables, whole grains, low-fat dairy products, and lean meat, fish, and poultry.  Eat frequent, small meals instead of three large meals each day. Eat your meals slowly, in a relaxed setting. Avoid bending over or lying down until 2-3 hours after eating.  Limit high-fat foods such as fatty meats or fried foods.  Limit your intake of oils, butter, and shortening to less than 8 teaspoons each day.  Avoid the following: ? Foods that cause symptoms. These may be different for different people. Keep a food diary to keep track of foods that cause symptoms. ? Alcohol. ? Drinking large amounts of liquid with meals. ? Eating meals during the 2-3 hours before bed.  Cook foods using methods other than frying. This may include baking, grilling, or broiling. Lifestyle   Maintain a healthy weight. Ask your health care provider what weight is healthy for you. If you need to lose weight, work with your health care provider to do so safely.  Exercise for at least 30 minutes on 5 or more days each week, or as told by your health care provider.  Avoid wearing clothes that fit tightly around your waist and chest.  Do not use any products that contain nicotine or tobacco, such as cigarettes and e-cigarettes. If you need help quitting, ask your health care provider.  Sleep with the head of your bed raised. Use a wedge under the mattress or blocks under the bed frame to raise the head of the bed. What foods are not recommended? The items listed may not be a complete  list. Talk with your dietitian about what dietary choices are best for you. Grains Pastries or quick breads with added fat. French toast. Vegetables Deep fried vegetables. French fries. Any vegetables prepared with added fat. Any vegetables that cause symptoms. For some people this may include tomatoes and tomato products, chili peppers, onions and garlic, and horseradish. Fruits Any fruits prepared with added fat. Any fruits that cause symptoms. For some people this may include citrus fruits, such as oranges, grapefruit, pineapple, and lemons. Meats and other protein foods High-fat meats, such as fatty beef or pork, hot dogs, ribs, ham, sausage, salami and bacon. Fried meat or protein, including fried fish and fried chicken. Nuts and nut butters. Dairy Whole milk and chocolate milk. Sour cream. Cream. Ice cream. Cream cheese. Milk shakes. Beverages Coffee and tea, with or without caffeine. Carbonated beverages. Sodas. Energy drinks. Fruit juice made with acidic fruits (such as orange or grapefruit). Tomato juice. Alcoholic drinks. Fats and oils Butter. Margarine. Shortening. Ghee. Sweets and desserts Chocolate and cocoa. Donuts. Seasoning and other foods Pepper. Peppermint and spearmint. Any condiments, herbs, or seasonings that cause symptoms. For some people, this may include curry, hot sauce, or vinegar-based salad dressings. Summary  When you have gastroesophageal reflux disease (GERD), food and lifestyle choices are very important to help ease the discomfort of GERD.  Eat frequent, small meals instead of three large meals each day. Eat your meals slowly, in a relaxed setting. Avoid bending over or lying down until 2-3 hours after eating.  Limit high-fat   foods such as fatty meat or fried foods. This information is not intended to replace advice given to you by your health care provider. Make sure you discuss any questions you have with your health care provider. Document Released:  11/06/2005 Document Revised: 11/07/2016 Document Reviewed: 11/07/2016 Elsevier Interactive Patient Education  2017 [employer]  

## 2017-08-13 NOTE — Progress Notes (Signed)
Subjective:    Patient ID: Amanda Andrews, female    DOB: 1961-10-15, 56 y.o.   MRN: [id number]  HPI 56 year old female here today to follow-up for dizziness and vomiting. She actually went to the emergency department 2 days ago on September 22 at that point she is Artie had symptoms for 3 days. Labs and head CT were essentially normal. She was given meclizine. She Feels the meclizine is actually been causing headaches next he took some ibuprofen last night.  She is feeling much better.    GERD - she feels like the pantoprazole is no longer working. She still experiencing some reflux up into the esophageal area. Normally she takes it at bedtime.   Review of Systems  BP 114/73   Pulse 83   Ht  (1.651 m)   Wt 193 lb (87.5 kg)   SpO2 98%   BMI 32.12 kg/m     Allergies  Allergen Reactions  . Azithromycin     After starting had nausea, vomiting, rash.     Past Medical History:  Diagnosis Date  . GERD (gastroesophageal reflux disease)   . Hypertension   . Impaired fasting glucose   . Obesity     Past Surgical History:  Procedure Laterality Date  . APPENDECTOMY    . removal cysts     on fallopian tubes    Social History   Social History  . Marital status: Married    Spouse name: N/A  . Number of children: N/A  . Years of education: N/A   Occupational History  . Not on file.   Social History Main Topics  . Smoking status: Never Smoker  . Smokeless tobacco: Never Used  . Alcohol use No  . Drug use: No  . Sexual activity: Not on file     Comment: separated, 2 kids.   Other Topics Concern  . Not on file   Social History Narrative  . No narrative on file    Family History  Problem Relation Age of Onset  . Stroke Mother   . Cancer Mother   . Alcohol abuse Father   . Hypertension Father   . Hypertension Brother   . Hypertension Brother   . Diabetes Unknown        grandparents, aunts    Outpatient Encounter Prescriptions as of 08/13/2017   Medication Sig  . lisinopril-hydrochlorothiazide (PRINZIDE,ZESTORETIC) 20-25 MG tablet Take 1 tablet by mouth daily.  . pantoprazole (PROTONIX) 40 MG tablet Take 1 tablet (40 mg total) by mouth daily.  . [DISCONTINUED] meclizine (ANTIVERT) 25 MG tablet Take 25 mg by mouth 3 (three) times daily as needed.   No facility-administered encounter medications on file as of 08/13/2017.          Objective:   Physical Exam  Constitutional: She is oriented to person, place, and time. She appears well-developed and well-nourished.  HENT:  Head: Normocephalic and atraumatic.  Right Ear: External ear normal.  Left Ear: External ear normal.  Nose: Nose normal.  Mouth/Throat: Oropharynx is clear and moist.  TMs and canals are clear.   Eyes: Pupils are equal, round, and reactive to light. Conjunctivae and EOM are normal.  Neck: Neck supple. No thyromegaly present.  Cardiovascular: Normal rate, regular rhythm and normal heart sounds.   Pulmonary/Chest: Effort normal and breath sounds normal. She has no wheezes.  Lymphadenopathy:    She has no cervical adenopathy.  Neurological: She is alert and oriented to person, place, and  time.  Positive Dix-Hallpike maneuver to the right. Did not see any significant nystagmus but she felt the intensity of the vertigo going to the right.  Skin: Skin is warm and dry.  Psychiatric: She has a normal mood and affect. Her behavior is normal.        Assessment & Plan:  Benign positional vertigo-given exercises to do on her on at home. She actually is significantly better today compared to when she went to the ED 2 days ago. Okay to stop the meclizine. Certainly could be contributing to her headache. Really push fluids today and try to eat a little bit. Hopefully okay to return to work tomorrow.  GERD-recommend trying the per tonics 30 minutes before breakfast in the morning and seeing if her symptoms improve. Make sure avoiding food triggers as well. If not  improving after the end of the week and please let me know and we can try switching to Nexium.

## 2017-09-15 ENCOUNTER — Other Ambulatory Visit: Payer: Self-pay | Admitting: Physician Assistant

## 2017-11-09 ENCOUNTER — Ambulatory Visit: Payer: BLUE CROSS/BLUE SHIELD | Admitting: Family Medicine

## 2017-11-09 ENCOUNTER — Encounter: Payer: Self-pay | Admitting: Family Medicine

## 2017-11-09 VITALS — BP 158/91 | HR 73 | Ht 65.0 in | Wt 196.0 lb

## 2017-11-09 DIAGNOSIS — H65191 Other acute nonsuppurative otitis media, right ear: Secondary | ICD-10-CM

## 2017-11-09 DIAGNOSIS — H5711 Ocular pain, right eye: Secondary | ICD-10-CM

## 2017-11-09 MED ORDER — PREDNISONE 20 MG PO TABS: 20.0000 mg | ORAL_TABLET | Freq: Every day | ORAL | 0 refills | Status: DC

## 2017-11-09 NOTE — Progress Notes (Signed)
Subjective:    Patient ID: Amanda Andrews, female    DOB: 06/29/1961, 56 y.o.   MRN: [id number]  HPI 56 year old female comes in today complaining of pain in her right eye.  It started yesterday afternoon.  She thought maybe her contact was causing some irritation and took out.  By time she left work and was on the drive home it was throbbing.  She irrigated it with water and then put some warm compresses on it in the evening.  When she woke up this morning it was still very red and irritated noted she says she has had some pressure in her right ear and a mild sore throat since last Sunday when she had another episode of vertigo which she gets occasionally.  He has not had any discharge drainage or crusting of the eye.    Review of Systems     BP (!) 158/91   Pulse 73   Ht 5\' 5"  (1.651 m)   Wt 196 lb (88.9 kg)   SpO2 98%   BMI 32.62 kg/m     Allergies  Allergen Reactions  . Azithromycin     After starting had nausea, vomiting, rash.     Past Medical History:  Diagnosis Date  . GERD (gastroesophageal reflux disease)   . Hypertension   . Impaired fasting glucose   . Obesity     Past Surgical History:  Procedure Laterality Date  . APPENDECTOMY    . removal cysts     on fallopian tubes    Social History   Socioeconomic History  . Marital status: Married    Spouse name: Not on file  . Number of children: Not on file  . Years of education: Not on file  . Highest education level: Not on file  Social Needs  . Financial resource strain: Not on file  . Food insecurity - worry: Not on file  . Food insecurity - inability: Not on file  . Transportation needs - medical: Not on file  . Transportation needs - non-medical: Not on file  Occupational History  . Not on file  Tobacco Use  . Smoking status: Never Smoker  . Smokeless tobacco: Never Used  Substance and Sexual Activity  . Alcohol use: No  . Drug use: No  . Sexual activity: Not on file    Comment: separated,  2 kids.  Other Topics Concern  . Not on file  Social History Narrative  . Not on file    Family History  Problem Relation Age of Onset  . Stroke Mother   . Cancer Mother   . Alcohol abuse Father   . Hypertension Father   . Hypertension Brother   . Hypertension Brother   . Diabetes Unknown        grandparents, aunts    Outpatient Encounter Medications as of 11/09/2017  Medication Sig  . lisinopril-hydrochlorothiazide (PRINZIDE,ZESTORETIC) 20-25 MG tablet TAKE 1 TABLET BY MOUTH DAILY.  . pantoprazole (PROTONIX) 40 MG tablet Take 1 tablet (40 mg total) by mouth daily.  . predniSONE (DELTASONE) 20 MG tablet Take 1 tablet (20 mg total) by mouth daily with breakfast.   No facility-administered encounter medications on file as of 11/09/2017.       Objective:   Physical Exam  Constitutional: She is oriented to person, place, and time. She appears well-developed and well-nourished.  HENT:  Head: Normocephalic and atraumatic.  Right Ear: External ear normal.  Left Ear: External ear normal.  Nose: Nose normal.  Mouth/Throat: Oropharynx is clear and moist.  TMs and canals are clear.  Fluorescent eyedrops placed just to see if there is any scratch on the cornea causing her symptoms.  I did not see any type of marker scratch on the cornea.  One drop of Cipro drops placed.  Tetracaine used to numb the eye.  Eyes: EOM are normal. Pupils are equal, round, and reactive to light. Right eye exhibits no discharge. Left eye exhibits no discharge.  The sclera is significantly injected over the right eye.  No lid edema.  She does have a little bit of makeup on today.  The vessels well on exam.  Neck: Neck supple. No thyromegaly present.  Pulmonary/Chest: Effort normal.  Lymphadenopathy:    She has no cervical adenopathy.  Neurological: She is alert and oriented to person, place, and time.  Skin: Skin is warm and dry.  Psychiatric: She has a normal mood and affect.          Assessment &  Plan:  Acute painful red eye on the right-because she has significant vision impairment today on exam, though she did not have her [place], I am referring her immediately to her eye doctor.  They are able to get her in within the next hour.  Effusion, right ear-we will treat with oral prednisone.  Though I did tell her that let her eye doctor no because if they did not want her on prednisone then she is to hold it.

## 2017-12-05 ENCOUNTER — Other Ambulatory Visit: Payer: Self-pay | Admitting: *Deleted

## 2017-12-05 MED ORDER — PANTOPRAZOLE SODIUM 40 MG PO TBEC: 40.0000 mg | DELAYED_RELEASE_TABLET | Freq: Every day | ORAL | 3 refills | Status: DC

## 2018-02-14 ENCOUNTER — Other Ambulatory Visit: Payer: Self-pay | Admitting: Physician Assistant

## 2018-03-09 ENCOUNTER — Other Ambulatory Visit: Payer: Self-pay | Admitting: Physician Assistant

## 2018-04-10 ENCOUNTER — Ambulatory Visit: Payer: BLUE CROSS/BLUE SHIELD | Admitting: Physician Assistant

## 2018-04-10 ENCOUNTER — Other Ambulatory Visit: Payer: Self-pay | Admitting: Family Medicine

## 2018-04-10 ENCOUNTER — Ambulatory Visit (INDEPENDENT_AMBULATORY_CARE_PROVIDER_SITE_OTHER): Payer: BLUE CROSS/BLUE SHIELD

## 2018-04-10 ENCOUNTER — Encounter: Payer: Self-pay | Admitting: Physician Assistant

## 2018-04-10 VITALS — BP 122/82 | HR 70 | Wt 186.0 lb

## 2018-04-10 DIAGNOSIS — G589 Mononeuropathy, unspecified: Secondary | ICD-10-CM | POA: Diagnosis not present

## 2018-04-10 DIAGNOSIS — Z130 Encounter for screening for diseases of the blood and blood-forming organs and certain disorders involving the immune mechanism: Secondary | ICD-10-CM

## 2018-04-10 DIAGNOSIS — M47816 Spondylosis without myelopathy or radiculopathy, lumbar region: Secondary | ICD-10-CM | POA: Diagnosis not present

## 2018-04-10 DIAGNOSIS — Z1211 Encounter for screening for malignant neoplasm of colon: Secondary | ICD-10-CM

## 2018-04-10 DIAGNOSIS — Z1231 Encounter for screening mammogram for malignant neoplasm of breast: Secondary | ICD-10-CM

## 2018-04-10 DIAGNOSIS — M79604 Pain in right leg: Secondary | ICD-10-CM

## 2018-04-10 DIAGNOSIS — M79671 Pain in right foot: Secondary | ICD-10-CM

## 2018-04-10 DIAGNOSIS — Z13220 Encounter for screening for lipoid disorders: Secondary | ICD-10-CM

## 2018-04-10 DIAGNOSIS — Z1329 Encounter for screening for other suspected endocrine disorder: Secondary | ICD-10-CM

## 2018-04-10 DIAGNOSIS — M25476 Effusion, unspecified foot: Secondary | ICD-10-CM

## 2018-04-10 DIAGNOSIS — K219 Gastro-esophageal reflux disease without esophagitis: Secondary | ICD-10-CM | POA: Diagnosis not present

## 2018-04-10 DIAGNOSIS — M5137 Other intervertebral disc degeneration, lumbosacral region: Secondary | ICD-10-CM | POA: Diagnosis not present

## 2018-04-10 DIAGNOSIS — I10 Essential (primary) hypertension: Secondary | ICD-10-CM | POA: Diagnosis not present

## 2018-04-10 DIAGNOSIS — R253 Fasciculation: Secondary | ICD-10-CM

## 2018-04-10 DIAGNOSIS — Z131 Encounter for screening for diabetes mellitus: Secondary | ICD-10-CM

## 2018-04-10 DIAGNOSIS — M5117 Intervertebral disc disorders with radiculopathy, lumbosacral region: Secondary | ICD-10-CM | POA: Diagnosis not present

## 2018-04-10 LAB — D-DIMER, QUANTITATIVE: D-Dimer, Quant: 0.21 mcg/mL FEU (ref <0.50)

## 2018-04-10 MED ORDER — ESOMEPRAZOLE MAGNESIUM 40 MG PO CPDR: 40.0000 mg | DELAYED_RELEASE_CAPSULE | Freq: Every day | ORAL | 0 refills | Status: DC

## 2018-04-10 NOTE — Progress Notes (Signed)
Vm left for patient to call office and obtain results. W.Manson [place]adoo, CCMA

## 2018-04-10 NOTE — Progress Notes (Signed)
umHPI:                                                                Amanda Andrews is a 57 y.o. female who presents to [facility] today for multiple concerns  HTN: has a concern about her antihypertensive medication. Currently taking Lisinopril-HCTZ daily. Compliant with medications. Denies adverse effects. BP is well-controlled. Reports she has spoken with friends/family and heard bad things about her blood pressure medication. She has a relative who takes Amlodipine and is wondering if she should switch to this. Denies vision change, headache, chest pain with exertion, orthopnea, lightheadedness, syncope and edema.    Gastroesophageal Reflux  She complains of coughing, early satiety and heartburn. She reports no choking, no dysphagia, no globus sensation or no nausea. This is a chronic problem. The current episode started more than 1 year ago. The problem occurs frequently. The problem has been unchanged. The heartburn is located in the substernum. The heartburn is of moderate intensity. The heartburn wakes her from sleep. The symptoms are aggravated by certain foods. Pertinent negatives include no melena or weight loss. Past procedures include an EGD. + barium swallow.   Right eyelid occasionally twitches. Worse when she is fatigued. Not associated with vision changes.   She also complains of right lower extremity pain and swelling. States that her right foot spontaneously drops while reclined in a chair (4-6 times in the last 2 months). Pain is throughout the entire leg and entire foot. She also reports altered sensation in her foot. Denies calf tenderness or claudication. Denies history of DVT. Wants to be checked for a blood clot.   Additionally, she would like a referral for a screening colonoscopy.    Depression screen [facility] 2/9 08/13/2017  Decreased Interest 0  Down, Depressed, Hopeless 0  PHQ - 2 Score 0    No flowsheet data  found.    Past Medical History:  Diagnosis Date  . GERD (gastroesophageal reflux disease)   . Hypertension   . Impaired fasting glucose   . Obesity   . Prediabetes 04/11/2018   Past Surgical History:  Procedure Laterality Date  . APPENDECTOMY    . removal cysts     on fallopian tubes   Social History   Tobacco Use  . Smoking status: Never Smoker  . Smokeless tobacco: Never Used  Substance Use Topics  . Alcohol use: No   family history includes Alcohol abuse in her father; Cancer in her mother; Diabetes in her unknown relative; Hypertension in her brother, brother, and father; Stroke in her mother.    ROS: negative except as noted in the HPI  Medications: Current Outpatient Medications  Medication Sig Dispense Refill  . lisinopril-hydrochlorothiazide (PRINZIDE,ZESTORETIC) 20-25 MG tablet Take 1 tablet by mouth daily. Must make appointment with PCP for refills. 30 tablet 0  . esomeprazole (NEXIUM) 40 MG capsule Take 1 capsule (40 mg total) by mouth at bedtime. 30 capsule 0   No current facility-administered medications for this visit.    Allergies  Allergen Reactions  . Azithromycin     After starting had nausea, vomiting, rash.        Objective:  BP 122/82   Pulse 70   Wt 186 lb (84.4  kg)   BMI 30.95 kg/m  Gen:  alert, not ill-appearing, no distress, appropriate for age, obese female HEENT: head normocephalic without obvious abnormality, conjunctiva and cornea clear, trachea midline Pulm: Normal work of breathing, normal phonation, clear to auscultation bilaterally, no wheezes, rales or rhonchi CV: Normal rate, regular rhythm, s1 and s2 distinct, no murmurs, clicks or rubs  Neuro: alert and oriented x 3, sensation diminished to monofilament testing in right toes, loss of vibratory sensation in right toes MSK: extremities atraumatic, normal gait and station Right Leg: no visible swelling or peripheral edema, no calf tenderness, strength 5/5 compared to  right Right foot: atraumatic, no deformity, diffuse tenderness of the metatarsal heads Skin: intact, no rashes on exposed skin, no jaundice, no cyanosis Psych: well-groomed, cooperative, good eye contact, euthymic mood, affect mood-congruent, speech is articulate, and thought processes clear and goal-directed    No results found for this or any previous visit (from the past 72 hour(s)). No results found.    Assessment and Plan: 57 y.o. female with   Hypertension goal BP (blood pressure) < 130/80 - Plan: COMPLETE METABOLIC PANEL WITH GFR  Diffuse pain in right lower extremity - Plan: D-dimer, quantitative (not at [facility])  Screening for thyroid disorder - Plan: TSH + free T4  Gastroesophageal reflux disease, esophagitis presence not specified - Plan: Ambulatory referral to Gastroenterology, esomeprazole (NEXIUM) 40 MG capsule  Colon cancer screening - Plan: Ambulatory referral to Gastroenterology  Screening for lipid disorders - Plan: Lipid Panel w/reflex Direct LDL  Screening for blood disease - Plan: CBC, COMPLETE METABOLIC PANEL WITH GFR  Swelling of first metatarsophalangeal (MTP) joint - Plan: Uric acid  Mononeuropathy - Plan: CBC, COMPLETE METABOLIC PANEL WITH GFR, TSH + free T4, Hemoglobin A1c, Vitamin B12, DG Lumbar Spine Complete  Screening for diabetes mellitus - Plan: Hemoglobin A1c  Fasciculations  Right foot pain  Mononeuropathy, Right lower extremity pain - unilateral involving right lower extremity: loss of vibratory sensation in the right toes, loss of sensation with monofilament testing in the right forefoot - will obtain plain film of the lumbar region as this is possible lumbar radiculopathy and check CMP, B12, CBC, TSH - very low suspicion of DVT based on history and PE, will order d-dimer  Right foot pain - possible metatarsalgia - referred to Sports Medicine - advised to wear supportive shoes and avoid barefoot walking  Hypertension - explained to  patient that she has taken Amlodipine in the past and had an intolerance - she changed her mind and wants to continue her current medications - counseled on therapeutic lifestyle changes - baby asa for primary prevention  GERD - no red flag symptoms - 1 month trial of high-dose PPI and lifestyle measures, reduce to lowest effective dose after 1 month  Fasciculations Right Eye - reassurance - sleep hygiene   Patient education and anticipatory guidance given Patient agrees with treatment plan Follow-up with PCP in 1 week or sooner as needed if symptoms worsen or fail to improve  Levonne Hubert PA-C

## 2018-04-10 NOTE — Progress Notes (Signed)
D-dimer test is normal - this rules out a blood clot anywhere in the body Pain is likely due to another cause such as arthritis or inflammatory condition Will call with other lab results by Friday

## 2018-04-10 NOTE — Patient Instructions (Signed)
For GERD: - start Nexium once daily either before breakfast or at bedtime - eliminate caffeine - avoid eating within 2 hours of bedtime - elevated head of bed - follow-up with GI  For your blood pressure: - Goal <130/80 - continue Lisinopril-HCTZ - baby aspirin 81 mg daily to help prevent heart attack/stroke - monitor and log blood pressures at home - check around the same time each day in a relaxed setting - Limit salt to <2000 mg/day - Follow DASH eating plan - limit alcohol to 2 standard drinks per day for men and 1 per day for women - avoid tobacco products - weight loss: 7% of current body weight - follow-up every 6 months for your blood pressure   For foot pain: - schedule appointment with Sports Medicine - BRING ALL OF YOUR MOST COMMONLY WORN SHOES - avoid walking barefoot. Wear supportive shoes.    Peripheral Neuropathy Peripheral neuropathy is a type of nerve damage. It affects nerves that carry signals between the spinal cord and other parts of the body. These are called peripheral nerves. With peripheral neuropathy, one nerve or a group of nerves may be damaged. What are the causes? Many things can damage peripheral nerves. For some people with peripheral neuropathy, the cause is unknown. Some causes include:  Diabetes. This is the most common cause of peripheral neuropathy.  Injury to a nerve.  Pressure or stress on a nerve that lasts a long time.  Too little vitamin B. Alcoholism can lead to this.  Infections.  Autoimmune diseases, such as multiple sclerosis and systemic lupus erythematosus.  Inherited nerve diseases.  Some medicines, such as cancer drugs.  Toxic substances, such as lead and mercury.  Too little blood flowing to the legs.  Kidney disease.  Thyroid disease.  What are the signs or symptoms? Different people have different symptoms. The symptoms you have will depend on which of your nerves is damaged. Common symptoms include:  Loss  of feeling (numbness) in the feet and hands.  Tingling in the feet and hands.  Pain that burns.  Very sensitive skin.  Weakness.  Not being able to move a part of the body (paralysis).  Muscle twitching.  Clumsiness or poor coordination.  Loss of balance.  Not being able to control your bladder.  Feeling dizzy.  Sexual problems.  How is this diagnosed? Peripheral neuropathy is a symptom, not a disease. Finding the cause of peripheral neuropathy can be hard. To figure that out, your health care provider will take a medical history and do a physical exam. A neurological exam will also be done. This involves checking things affected by your brain, spinal cord, and nerves (nervous system). For example, your health care provider will check your reflexes, how you move, and what you can feel. Other types of tests may also be ordered, such as:  Blood tests.  A test of the fluid in your spinal cord.  Imaging tests, such as CT scans or an MRI.  Electromyography (EMG). This test checks the nerves that control muscles.  Nerve conduction velocity tests. These tests check how fast messages pass through your nerves.  Nerve biopsy. A small piece of nerve is removed. It is then checked under a microscope.  How is this treated?  Medicine is often used to treat peripheral neuropathy. Medicines may include: ? Pain-relieving medicines. Prescription or over-the-counter medicine may be suggested. ? Antiseizure medicine. This may be used for pain. ? Antidepressants. These also may help ease pain from neuropathy. ?  Lidocaine. This is a numbing medicine. You might wear a patch or be given a shot. ? Mexiletine. This medicine is typically used to help control irregular heart rhythms.  Surgery. Surgery may be needed to relieve pressure on a nerve or to destroy a nerve that is causing pain.  Physical therapy to help movement.  Assistive devices to help movement. Follow these instructions at  home:  Only take over-the-counter or prescription medicines as directed by your health care provider. Follow the instructions carefully for any given medicines. Do not take any other medicines without first getting approval from your health care provider.  If you have diabetes, work closely with your health care provider to keep your blood sugar under control.  If you have numbness in your feet: ? Check every day for signs of injury or infection. Watch for redness, warmth, and swelling. ? Wear padded socks and comfortable shoes. These help protect your feet.  Do not do things that put pressure on your damaged nerve.  Do not smoke. Smoking keeps blood from getting to damaged nerves.  Avoid or limit alcohol. Too much alcohol can cause a lack of B vitamins. These vitamins are needed for healthy nerves.  Develop a good support system. Coping with peripheral neuropathy can be stressful. Talk to a mental health specialist or join a support group if you are struggling.  Follow up with your health care provider as directed. Contact a health care provider if:  You have new signs or symptoms of peripheral neuropathy.  You are struggling emotionally from dealing with peripheral neuropathy.  You have a fever. Get help right away if:  You have an injury or infection that is not healing.  You feel very dizzy or begin vomiting.  You have chest pain.  You have trouble breathing. This information is not intended to replace advice given to you by your health care provider. Make sure you discuss any questions you have with your health care provider. Document Released: 10/27/2002 Document Revised: 04/13/2016 Document Reviewed: 07/14/2013 Elsevier Interactive Patient Education  2017 [employer].

## 2018-04-11 ENCOUNTER — Encounter: Payer: Self-pay | Admitting: Physician Assistant

## 2018-04-11 DIAGNOSIS — R7303 Prediabetes: Secondary | ICD-10-CM

## 2018-04-11 HISTORY — DX: Prediabetes: R73.03

## 2018-04-11 LAB — URIC ACID: Uric Acid, Serum: 4.1 mg/dL (ref 2.5–7.0)

## 2018-04-11 LAB — CBC
HEMATOCRIT: 40.4 % (ref 35.0–45.0)
HEMOGLOBIN: 13.7 g/dL (ref 11.7–15.5)
MCH: 29 pg (ref 27.0–33.0)
MCHC: 33.9 g/dL (ref 32.0–36.0)
MCV: 85.4 fL (ref 80.0–100.0)
MPV: 10.7 fL (ref 7.5–12.5)
Platelets: 269 10*3/uL (ref 140–400)
RBC: 4.73 10*6/uL (ref 3.80–5.10)
RDW: 12.5 % (ref 11.0–15.0)
WBC: 5.6 10*3/uL (ref 3.8–10.8)

## 2018-04-11 LAB — COMPLETE METABOLIC PANEL WITH GFR
AG Ratio: 1.5 (calc) (ref 1.0–2.5)
ALBUMIN MSPROF: 4.1 g/dL (ref 3.6–5.1)
ALT: 16 U/L (ref 6–29)
AST: 17 U/L (ref 10–35)
Alkaline phosphatase (APISO): 82 U/L (ref 33–130)
BUN: 23 mg/dL (ref 7–25)
CALCIUM: 9.3 mg/dL (ref 8.6–10.4)
CO2: 29 mmol/L (ref 20–32)
Chloride: 107 mmol/L (ref 98–110)
Creat: 0.92 mg/dL (ref 0.50–1.05)
GFR, EST AFRICAN AMERICAN: 80 mL/min/{1.73_m2} (ref >=60)
GFR, EST NON AFRICAN AMERICAN: 69 mL/min/{1.73_m2} (ref >=60)
GLUCOSE: 94 mg/dL (ref 65–99)
Globulin: 2.7 g/dL (calc) (ref 1.9–3.7)
Potassium: 4.6 mmol/L (ref 3.5–5.3)
Sodium: 140 mmol/L (ref 135–146)
TOTAL PROTEIN: 6.8 g/dL (ref 6.1–8.1)
Total Bilirubin: 0.4 mg/dL (ref 0.2–1.2)

## 2018-04-11 LAB — LIPID PANEL W/REFLEX DIRECT LDL
CHOL/HDL RATIO: 3 (calc) (ref <5.0)
CHOLESTEROL: 179 mg/dL (ref <200)
HDL: 59 mg/dL (ref >50)
LDL CHOLESTEROL (CALC): 103 mg/dL — AB
Non-HDL Cholesterol (Calc): 120 mg/dL (calc) (ref <130)
TRIGLYCERIDES: 77 mg/dL (ref <150)

## 2018-04-11 LAB — TSH+FREE T4: TSH W/REFLEX TO FT4: 1.54 mIU/L (ref 0.40–4.50)

## 2018-04-11 LAB — HEMOGLOBIN A1C
Hgb A1c MFr Bld: 5.8 % of total Hgb — ABNORMAL HIGH (ref <5.7)
Mean Plasma Glucose: 120 (calc)
eAG (mmol/L): 6.6 (calc)

## 2018-04-11 LAB — VITAMIN B12: Vitamin B-12: 794 pg/mL (ref 200–1100)

## 2018-04-11 NOTE — Progress Notes (Signed)
Labs show prediabetes - this indicates there is insulin resistance in the body, which increases risk of developing type 2 diabetes Recommend aggressive lifestyle changes - ADA diet (American Diabetes Association) - limit carbs to 30g per meal and 15g per snack - avoid sweetened beverages and simple carbs like sugar, white bread and white pasta - regular aerobic exercise - losing 5% of current body weight If she would like a referral to a nutritionist, let us know

## 2018-04-15 ENCOUNTER — Encounter: Payer: Self-pay | Admitting: Physician Assistant

## 2018-04-15 DIAGNOSIS — Z1211 Encounter for screening for malignant neoplasm of colon: Secondary | ICD-10-CM | POA: Insufficient documentation

## 2018-04-15 DIAGNOSIS — R253 Fasciculation: Secondary | ICD-10-CM | POA: Insufficient documentation

## 2018-04-15 DIAGNOSIS — M25476 Effusion, unspecified foot: Secondary | ICD-10-CM | POA: Insufficient documentation

## 2018-04-15 DIAGNOSIS — G589 Mononeuropathy, unspecified: Secondary | ICD-10-CM | POA: Insufficient documentation

## 2018-04-15 DIAGNOSIS — M79671 Pain in right foot: Secondary | ICD-10-CM | POA: Insufficient documentation

## 2018-04-22 ENCOUNTER — Ambulatory Visit: Payer: BLUE CROSS/BLUE SHIELD | Admitting: Sports Medicine

## 2018-04-22 ENCOUNTER — Ambulatory Visit: Payer: BLUE CROSS/BLUE SHIELD | Admitting: Family Medicine

## 2018-04-26 ENCOUNTER — Ambulatory Visit (INDEPENDENT_AMBULATORY_CARE_PROVIDER_SITE_OTHER): Payer: BLUE CROSS/BLUE SHIELD

## 2018-04-26 DIAGNOSIS — Z1231 Encounter for screening mammogram for malignant neoplasm of breast: Secondary | ICD-10-CM | POA: Diagnosis not present

## 2018-04-29 ENCOUNTER — Ambulatory Visit (INDEPENDENT_AMBULATORY_CARE_PROVIDER_SITE_OTHER): Payer: BLUE CROSS/BLUE SHIELD | Admitting: Sports Medicine

## 2018-04-29 ENCOUNTER — Encounter: Payer: Self-pay | Admitting: Sports Medicine

## 2018-04-29 ENCOUNTER — Ambulatory Visit (INDEPENDENT_AMBULATORY_CARE_PROVIDER_SITE_OTHER): Payer: BLUE CROSS/BLUE SHIELD

## 2018-04-29 DIAGNOSIS — M79671 Pain in right foot: Secondary | ICD-10-CM | POA: Diagnosis not present

## 2018-04-29 DIAGNOSIS — M5416 Radiculopathy, lumbar region: Secondary | ICD-10-CM | POA: Diagnosis not present

## 2018-04-29 DIAGNOSIS — M79674 Pain in right toe(s): Secondary | ICD-10-CM | POA: Diagnosis not present

## 2018-04-29 MED ORDER — PREDNISONE 50 MG PO TABS: ORAL_TABLET | ORAL | 0 refills | Status: DC

## 2018-04-29 MED ORDER — TRAMADOL HCL 50 MG PO TABS: 50.0000 mg | ORAL_TABLET | Freq: Three times a day (TID) | ORAL | 0 refills | Status: DC | PRN

## 2018-04-29 NOTE — Progress Notes (Signed)
Subjective:    I'm seeing this patient as a consultation for: Dr. 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  No trauma.  In addition she endorses back discomfort with occasional radiation of pain down to the last 2 toes on her right foot, worse with sitting, flexion, Valsalva, no bowel or bladder dysfunction, saddle numbness, constitutional symptoms.  I reviewed the past medical history, family history, social history, surgical history, and allergies today and no changes were needed.  Please see the problem list section below in epic for further details.  Past Medical History: Past Medical History:  Diagnosis Date  . GERD (gastroesophageal reflux disease)   . Hypertension   . Impaired fasting glucose   . Obesity   . Prediabetes 04/11/2018   Past Surgical History: Past Surgical History:  Procedure Laterality Date  . APPENDECTOMY    . removal cysts     on fallopian tubes   Social History: Social History   Socioeconomic History  . Marital status: Married    Spouse name: Not on file  . Number of children: Not on file  . Years of education: Not on file  . Highest education level: Not on file  Occupational History  . Not on file  Social Needs  . Financial resource strain: Not on file  . Food insecurity:    Worry: Not on file    Inability: Not on file  . Transportation needs:    Medical: Not on file    Non-medical: Not on file  Tobacco Use  . Smoking status: Never Smoker  . Smokeless tobacco: Never Used  Substance and Sexual Activity  . Alcohol use: No  . Drug use: No  . Sexual activity: Not on file    Comment: separated, 2 kids.  Lifestyle  . Physical activity:    Days per week: Not on file    Minutes per session: Not on file  . Stress: Not on file    Relationships  . Social connections:    Talks on phone: Not on file    Gets together: Not on file    Attends religious service: Not on file    Active member of club or organization: Not on file    Attends meetings of clubs or organizations: Not on file    Relationship status: Not on file  Other Topics Concern  . Not on file  Social History Narrative  . Not on file   Family History: Family History  Problem Relation Age of Onset  . Stroke Mother   . Cancer Mother   . Alcohol abuse Father   . Hypertension Father   . Hypertension Brother   . Hypertension Brother   . Diabetes Unknown        grandparents, aunts   Allergies: Allergies  Allergen Reactions  . Azithromycin     After starting had nausea, vomiting, rash.    Medications: See med rec.  Review of Systems: No headache, visual changes, nausea, vomiting, diarrhea, constipation, dizziness, abdominal pain, skin rash, fevers, chills, night sweats, weight loss, swollen lymph nodes, body aches, joint swelling, muscle aches, chest pain, shortness of breath, mood changes, visual or auditory hallucinations.   Objective:   General: Well Developed, well nourished, and in no acute distress.  Neuro:  Extra-ocular muscles  intact, able to move all 4 extremities, sensation grossly intact.  Deep tendon reflexes tested were normal. Psych: Alert and oriented, mood congruent with affect. ENT:  Ears and nose appear unremarkable.  Hearing grossly normal. Neck: Unremarkable overall appearance, trachea midline.  No visible thyroid enlargement. Eyes: Conjunctivae and lids appear unremarkable.  Pupils equal and round. Skin: Warm and dry, no rashes noted.  Cardiovascular: Pulses palpable, no extremity edema. Right foot: No visible erythema or swelling. Range of motion is full in all directions. Strength is 5/5 in all directions. No hallux valgus. No pes cavus or pes planus. No abnormal callus noted. No pain over the navicular prominence, or  base of fifth metatarsal. No tenderness to palpation of the calcaneal insertion of plantar fascia. No pain at the Achilles insertion. No pain over the calcaneal bursa. No pain of the retrocalcaneal bursa. Tender to palpation dorsally over the third and fourth metatarsal shafts. No hallux rigidus or limitus. No tenderness palpation over interphalangeal joints. No pain with compression of the metatarsal heads. Neurovascularly intact distally.  Foot x-rays personally reviewed, overall unremarkable as expected, no periosteal irregularity around the third fourth metatarsal shafts  Impression and Recommendations:   This case required medical decision making of moderate complexity.  Right foot pain Suspect third and fourth metatarsal stress injury. X-rays, postop shoe for 3 months. Switching to tramadol for pain. 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  Right lumbar radiculitis Right S1 distribution. 5 days of prednisone, formal physical therapy. Return in 1 month, MR for interventional planning if no better. ___________________________________________ Ihor Austinhomas J. Benjamin Stainhekkekandam, M.D., ABFM., CAQSM. Primary Care and Sports Medicine [facility]  Adjunct Instructor of Family Medicine  [employer]

## 2018-04-29 NOTE — Assessment & Plan Note (Signed)
Suspect third and fourth metatarsal stress injury. X-rays, postop shoe for 3 months. Switching to tramadol for pain. 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

## 2018-04-29 NOTE — Assessment & Plan Note (Signed)
Right S1 distribution. 5 days of prednisone, formal physical therapy. Return in 1 month, MR for interventional planning if no better.

## 2018-05-08 ENCOUNTER — Other Ambulatory Visit: Payer: Self-pay | Admitting: Physician Assistant

## 2018-05-08 DIAGNOSIS — K219 Gastro-esophageal reflux disease without esophagitis: Secondary | ICD-10-CM

## 2018-05-15 ENCOUNTER — Other Ambulatory Visit: Payer: Self-pay | Admitting: *Deleted

## 2018-05-15 DIAGNOSIS — L237 Allergic contact dermatitis due to plants, except food: Secondary | ICD-10-CM

## 2018-05-15 MED ORDER — TRIAMCINOLONE ACETONIDE 0.5 % EX CREA: 1.0000 "application " | TOPICAL_CREAM | Freq: Two times a day (BID) | CUTANEOUS | 3 refills | Status: DC

## 2018-06-08 ENCOUNTER — Other Ambulatory Visit: Payer: Self-pay | Admitting: Family Medicine

## 2018-06-08 ENCOUNTER — Other Ambulatory Visit: Payer: Self-pay | Admitting: Physician Assistant

## 2018-06-08 DIAGNOSIS — K219 Gastro-esophageal reflux disease without esophagitis: Secondary | ICD-10-CM

## 2018-07-09 ENCOUNTER — Other Ambulatory Visit: Payer: Self-pay | Admitting: Family Medicine

## 2018-07-15 ENCOUNTER — Encounter: Payer: Self-pay | Admitting: Physician Assistant

## 2018-08-09 ENCOUNTER — Other Ambulatory Visit: Payer: Self-pay | Admitting: Family Medicine

## 2018-08-11 ENCOUNTER — Other Ambulatory Visit: Payer: Self-pay | Admitting: Family Medicine

## 2018-08-16 ENCOUNTER — Other Ambulatory Visit: Payer: Self-pay | Admitting: Family Medicine

## 2018-08-20 ENCOUNTER — Other Ambulatory Visit: Payer: Self-pay | Admitting: Family Medicine

## 2018-08-20 DIAGNOSIS — K219 Gastro-esophageal reflux disease without esophagitis: Secondary | ICD-10-CM

## 2018-09-14 ENCOUNTER — Other Ambulatory Visit: Payer: Self-pay | Admitting: Family Medicine

## 2018-09-14 DIAGNOSIS — K219 Gastro-esophageal reflux disease without esophagitis: Secondary | ICD-10-CM

## 2018-09-30 ENCOUNTER — Ambulatory Visit (INDEPENDENT_AMBULATORY_CARE_PROVIDER_SITE_OTHER): Payer: BLUE CROSS/BLUE SHIELD | Admitting: Physician Assistant

## 2018-09-30 ENCOUNTER — Encounter: Payer: Self-pay | Admitting: Physician Assistant

## 2018-09-30 VITALS — BP 118/78 | HR 77 | Ht 65.0 in | Wt 174.0 lb

## 2018-09-30 DIAGNOSIS — I8001 Phlebitis and thrombophlebitis of superficial vessels of right lower extremity: Secondary | ICD-10-CM | POA: Diagnosis not present

## 2018-09-30 DIAGNOSIS — T148XXA Other injury of unspecified body region, initial encounter: Secondary | ICD-10-CM | POA: Diagnosis not present

## 2018-09-30 LAB — CBC WITH DIFFERENTIAL/PLATELET
Basophils Absolute: 63 cells/uL (ref 0–200)
Basophils Relative: 0.7 %
EOS ABS: 99 {cells}/uL (ref 15–500)
Eosinophils Relative: 1.1 %
HCT: 43.6 % (ref 35.0–45.0)
Hemoglobin: 15 g/dL (ref 11.7–15.5)
Lymphs Abs: 1989 cells/uL (ref 850–3900)
MCH: 29.5 pg (ref 27.0–33.0)
MCHC: 34.4 g/dL (ref 32.0–36.0)
MCV: 85.8 fL (ref 80.0–100.0)
MONOS PCT: 8 %
MPV: 10.6 fL (ref 7.5–12.5)
Neutro Abs: 6129 cells/uL (ref 1500–7800)
Neutrophils Relative %: 68.1 %
PLATELETS: 363 10*3/uL (ref 140–400)
RBC: 5.08 10*6/uL (ref 3.80–5.10)
RDW: 12.8 % (ref 11.0–15.0)
TOTAL LYMPHOCYTE: 22.1 %
WBC: 9 10*3/uL (ref 3.8–10.8)
WBCMIX: 720 {cells}/uL (ref 200–950)

## 2018-09-30 NOTE — Progress Notes (Signed)
   Subjective:    Patient ID: Amanda Andrews, female    DOB: 07/24/1961, 57 y.o.   MRN: [id number]  HPI  Patient is a 57 year old female with hypertension who presents to the clinic with right medial leg bruising.  She noticed the bruising starting on Thursday night after work.  Has continued and got much worse Friday morning.  It is slightly tender to touch.  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  Her bilateral knees will hurt off and on but no real worsening of pain. She is not aware of any injury or trauma. She denies any knee popping. Hx of bakers cyst but no posterior knee masses. No SOB. No recent surgery, travel. No hx of DVT.   .. Active Ambulatory Problems    Diagnosis Date Noted  . HYPERTENSION, BENIGN ESSENTIAL 09/19/2006  . GERD 09/19/2006  . SKIN TAG 04/02/2009  . Right lumbar radiculitis 03/18/2008  . IMPAIRED FASTING GLUCOSE 03/18/2008  . Heartburn 01/11/2015  . Obesity 09/22/2015  . Abnormal weight gain 09/22/2015  . Venous stasis 09/22/2015  . Bilateral edema of lower extremity 09/22/2015  . Bloating 07/10/2016  . No energy 07/10/2016  . Prediabetes 04/11/2018  . Colon cancer screening 04/15/2018  . Swelling of first metatarsophalangeal (MTP) joint 04/15/2018  . Mononeuropathy 04/15/2018  . Fasciculations 04/15/2018  . Right foot pain 04/15/2018   Resolved Ambulatory Problems    Diagnosis Date Noted  . Poison ivy dermatitis 08/03/2016  . Acute cystitis 09/15/2016   Past Medical History:  Diagnosis Date  . GERD (gastroesophageal reflux disease)   . Hypertension       Review of Systems    see HPI.  Objective:   Physical Exam  Constitutional: She is oriented to person, place, and time. She appears well-developed and well-nourished.  Cardiovascular: Normal rate and regular rhythm.  Pulmonary/Chest: Effort normal and breath sounds normal. She has no wheezes.  Musculoskeletal:  Right calf: 41.5cm Left calf: 43cm  NROM of bilateral knees.    No tenderness over joint space to palpation.  No laxity of ACL, PCL, MCL, LCL.  No masses palpated.   Neurological: She is alert and oriented to person, place, and time.  Skin: No rash noted.  Right medial leg tender to palpation with extensive bruising and some slight warmth over area.   Psychiatric: She has a normal mood and affect. Her behavior is normal.          Assessment & Plan:  Marland KitchenMarland KitchenDiagnoses and all orders for this visit:  Thrombophlebitis of superficial veins of right lower extremity -     CBC with Differential/Platelet  Bruising -     CBC with Differential/Platelet   Risk of DVT low with no injury, surgery, travel, extensive calf pain/swelling, SOB. I suspect some thrombophlebitis or perhaps some internal knee injury that caused brusing. Her knee is pretty stable and no pain. Start ibuprofen 800mg  TID for 5 days. Use ace wrap for compression. Encouraged leg elevation. Discussed warning signs of DVT. Follow up as needed. Will check CBC to make sure no reason for easy bruising.

## 2018-10-01 ENCOUNTER — Encounter: Payer: Self-pay | Admitting: Physician Assistant

## 2018-10-01 NOTE — Progress Notes (Signed)
Call pt: hemoglobin great. WBC not elevated. Platelets perfect.

## 2018-10-03 ENCOUNTER — Other Ambulatory Visit: Payer: Self-pay

## 2018-10-03 MED ORDER — LISINOPRIL-HYDROCHLOROTHIAZIDE 20-25 MG PO TABS: 1.0000 | ORAL_TABLET | Freq: Every day | ORAL | 0 refills | Status: DC

## 2018-10-14 ENCOUNTER — Ambulatory Visit (INDEPENDENT_AMBULATORY_CARE_PROVIDER_SITE_OTHER): Payer: BLUE CROSS/BLUE SHIELD | Admitting: Family Medicine

## 2018-10-14 ENCOUNTER — Encounter: Payer: Self-pay | Admitting: Family Medicine

## 2018-10-14 VITALS — BP 126/69 | HR 77 | Ht 63.58 in | Wt 177.0 lb

## 2018-10-14 DIAGNOSIS — R7301 Impaired fasting glucose: Secondary | ICD-10-CM | POA: Diagnosis not present

## 2018-10-14 DIAGNOSIS — K219 Gastro-esophageal reflux disease without esophagitis: Secondary | ICD-10-CM | POA: Diagnosis not present

## 2018-10-14 DIAGNOSIS — Z1211 Encounter for screening for malignant neoplasm of colon: Secondary | ICD-10-CM | POA: Diagnosis not present

## 2018-10-14 DIAGNOSIS — I10 Essential (primary) hypertension: Secondary | ICD-10-CM | POA: Diagnosis not present

## 2018-10-14 LAB — POCT GLYCOSYLATED HEMOGLOBIN (HGB A1C): HEMOGLOBIN A1C: 5.7 % — AB (ref 4.0–5.6)

## 2018-10-14 MED ORDER — LISINOPRIL-HYDROCHLOROTHIAZIDE 20-25 MG PO TABS: 1.0000 | ORAL_TABLET | Freq: Every day | ORAL | 3 refills | Status: DC

## 2018-10-14 NOTE — Progress Notes (Signed)
Subjective:    CC: Blood Pressure.  HPI:  Hypertension- Pt denies chest pain, SOB, dizziness, or heart palpitations.  Taking meds as directed w/o problems.  Denies medication side effects.    Impaired fasting glucose-no increased thirst or urination. No symptoms consistent with hypoglycemia.  GERD - she is doing well on nexium. Takes it most night but not every night.  Reports that she is been doing better with her diet and avoiding triggers.  Past medical history, Surgical history, Family history not pertinant except as noted below, Social history, Allergies, and medications have been entered into the medical record, reviewed, and corrections made.   Review of Systems: No fevers, chills, night sweats, weight loss, chest pain, or shortness of breath.   Objective:    General: Well Developed, well nourished, and in no acute distress.  Neuro: Alert and oriented x3, extra-ocular muscles intact, sensation grossly intact.  HEENT: Normocephalic, atraumatic  Skin: Warm and dry, no rashes. Cardiac: Regular rate and rhythm, no murmurs rubs or gallops, no lower extremity edema.  Respiratory: Clear to auscultation bilaterally. Not using accessory muscles, speaking in full sentences.   Impression and Recommendations:    HTN - Well controlled. Continue current regimen. Follow up in  6 months.    IFG - Well controlled. Continue current regimen. Follow up in  6months.    GERD - On to take the Nexium PRN.    Encouraged her to schedule her mammogram.  Cancer screening discussed-referral placed.  [place] see [place].

## 2018-10-15 LAB — BASIC METABOLIC PANEL WITH GFR
BUN: 17 mg/dL (ref 7–25)
CO2: 29 mmol/L (ref 20–32)
CREATININE: 0.94 mg/dL (ref 0.50–1.05)
Calcium: 9.8 mg/dL (ref 8.6–10.4)
Chloride: 102 mmol/L (ref 98–110)
GFR, EST AFRICAN AMERICAN: 78 mL/min/{1.73_m2} (ref >=60)
GFR, Est Non African American: 67 mL/min/{1.73_m2} (ref >=60)
Glucose, Bld: 94 mg/dL (ref 65–99)
Potassium: 4.1 mmol/L (ref 3.5–5.3)
SODIUM: 139 mmol/L (ref 135–146)

## 2018-10-15 NOTE — Progress Notes (Signed)
All labs are normal. 

## 2018-10-28 ENCOUNTER — Encounter: Payer: Self-pay | Admitting: Obstetrics & Gynecology

## 2018-10-28 ENCOUNTER — Ambulatory Visit (INDEPENDENT_AMBULATORY_CARE_PROVIDER_SITE_OTHER): Payer: BLUE CROSS/BLUE SHIELD | Admitting: Obstetrics & Gynecology

## 2018-10-28 VITALS — BP 133/78 | HR 79 | Resp 16 | Ht 63.0 in | Wt 178.0 lb

## 2018-10-28 DIAGNOSIS — Z113 Encounter for screening for infections with a predominantly sexual mode of transmission: Secondary | ICD-10-CM

## 2018-10-28 DIAGNOSIS — N393 Stress incontinence (female) (male): Secondary | ICD-10-CM

## 2018-10-28 DIAGNOSIS — Z01419 Encounter for gynecological examination (general) (routine) without abnormal findings: Secondary | ICD-10-CM | POA: Diagnosis not present

## 2018-10-28 DIAGNOSIS — Z124 Encounter for screening for malignant neoplasm of cervix: Secondary | ICD-10-CM

## 2018-10-28 DIAGNOSIS — Z1151 Encounter for screening for human papillomavirus (HPV): Secondary | ICD-10-CM | POA: Diagnosis not present

## 2018-10-28 NOTE — Progress Notes (Signed)
Subjective:     Amanda ChardDeborah K Andrews is a 57 y.o. female here for a routine exam.  Current complaints: occasional pelvic pain, especially over old appendectomy scar.  Patient is sexually active occasionally with boyfriend.  They are 8-year relationship ended in March however they occasionally have an evening together.  She does not think that he has had intercourse with other people however she does agree to STD testing today. Has occasional leakage of urine with coughing and laughing.  Wears a small thin pad each day.   Gynecologic History No LMP recorded. Patient is postmenopausal. Contraception: post menopausal status Last Pap: years ago per patient--normal. Last mammogram: 6/19. Results were: normal  Obstetric History OB History  No data available     The following portions of the patient's history were reviewed and updated as appropriate: allergies, current medications, past family history, past medical history, past social history, past surgical history and problem list.  Review of Systems Pertinent items noted in HPI and remainder of comprehensive ROS otherwise negative.    Objective:      Vitals:   10/28/18 1307  Resp: 16  Weight: 178 lb (80.7 kg)  Height: 5\' 3"  (1.6 m)   Vitals:  WNL General appearance: alert, cooperative and no distress  HEENT: Normocephalic, without obvious abnormality, atraumatic Eyes: negative Throat: lips, mucosa, and tongue normal; teeth and gums normal  Respiratory: Clear to auscultation bilaterally  CV: Regular rate and rhythm  Breasts:  Normal appearance, no masses or tenderness, no nipple retraction or dimpling  GI: Soft, non-tender; bowel sounds normal; no masses,  no organomegaly  GU: External Genitalia:  Tanner V, no lesion Urethra:  No prolapse   Vagina: Pink, normal rugae, no blood or discharge  Cervix: No CMT, no lesion, amount of bleeding after Pap smear  Uterus:  Normal size and contour, non tender  Adnexa: Normal, no masses, non  tender  Musculoskeletal: No edema, redness or tenderness in the calves or thighs  Skin: No lesions or rash  Lymphatic: Axillary adenopathy: none     Psychiatric: Normal mood and behavior       Assessment:    Healthy female exam.    Plan:    1.  Pap with cotesting 2.  Yearly mammogram 3.  Colonoscopy--Jan 2020 4.  STD testing 5.  Occasional urinary incontinence especially with coughing laughing.  Kegel exercise reviewed.  Patient will try these at home.  If not better, physical therapy could help. 6.  Patient should use unscented bath wash and unscented pads to avoid vulvar irritation.

## 2018-10-29 LAB — HEPATITIS C ANTIBODY
Hepatitis C Ab: NONREACTIVE
SIGNAL TO CUT-OFF: 0.02 (ref <1.00)

## 2018-10-29 LAB — RPR: RPR Ser Ql: NONREACTIVE

## 2018-10-29 LAB — HEPATITIS B SURFACE ANTIGEN: Hepatitis B Surface Ag: NONREACTIVE

## 2018-10-29 LAB — HIV ANTIBODY (ROUTINE TESTING W REFLEX): HIV 1&2 Ab, 4th Generation: NONREACTIVE

## 2018-10-30 ENCOUNTER — Telehealth: Payer: Self-pay | Admitting: *Deleted

## 2018-10-30 LAB — CYTOLOGY - PAP
Bacterial vaginitis: POSITIVE — AB
Chlamydia: NEGATIVE
DIAGNOSIS: NEGATIVE
HPV (WINDOPATH): NOT DETECTED
Neisseria Gonorrhea: NEGATIVE
Trichomonas: NEGATIVE

## 2018-10-30 MED ORDER — METRONIDAZOLE 500 MG PO TABS: 500.0000 mg | ORAL_TABLET | Freq: Two times a day (BID) | ORAL | 0 refills | Status: DC

## 2018-10-30 NOTE — Telephone Encounter (Signed)
-----   Message from Lesly DukesKelly H Leggett, MD sent at 10/30/2018 12:31 PM EST ----- BV on wet prep--Rx with Flagyl per protocol.

## 2018-10-30 NOTE — Telephone Encounter (Signed)
LM on voicemail to call office for test results.  Per Dr Kirkland HunLeggett RX for BV sent to pt's pharmacy to treat BV

## 2019-09-01 ENCOUNTER — Ambulatory Visit (INDEPENDENT_AMBULATORY_CARE_PROVIDER_SITE_OTHER): Payer: BLUE CROSS/BLUE SHIELD | Admitting: Family Medicine

## 2019-09-01 ENCOUNTER — Other Ambulatory Visit: Payer: Self-pay

## 2019-09-01 ENCOUNTER — Encounter: Payer: Self-pay | Admitting: Family Medicine

## 2019-09-01 VITALS — BP 128/78 | HR 77 | Ht 64.0 in | Wt 193.0 lb

## 2019-09-01 DIAGNOSIS — G8929 Other chronic pain: Secondary | ICD-10-CM

## 2019-09-01 DIAGNOSIS — M79604 Pain in right leg: Secondary | ICD-10-CM

## 2019-09-01 DIAGNOSIS — R7301 Impaired fasting glucose: Secondary | ICD-10-CM | POA: Diagnosis not present

## 2019-09-01 DIAGNOSIS — M79605 Pain in left leg: Secondary | ICD-10-CM

## 2019-09-01 DIAGNOSIS — R252 Cramp and spasm: Secondary | ICD-10-CM

## 2019-09-01 DIAGNOSIS — Z23 Encounter for immunization: Secondary | ICD-10-CM | POA: Diagnosis not present

## 2019-09-01 DIAGNOSIS — M5440 Lumbago with sciatica, unspecified side: Secondary | ICD-10-CM

## 2019-09-01 DIAGNOSIS — R7989 Other specified abnormal findings of blood chemistry: Secondary | ICD-10-CM | POA: Diagnosis not present

## 2019-09-01 DIAGNOSIS — M5442 Lumbago with sciatica, left side: Secondary | ICD-10-CM

## 2019-09-01 MED ORDER — DULOXETINE HCL 30 MG PO CPEP: 30.0000 mg | ORAL_CAPSULE | Freq: Every day | ORAL | 1 refills | Status: DC

## 2019-09-01 NOTE — Assessment & Plan Note (Signed)
Due for A1C today.  

## 2019-09-01 NOTE — Progress Notes (Signed)
Acute Office Visit  Subjective:    Patient ID: Amanda Andrews, female    DOB: 12-15-1960, 58 y.o.   MRN: [id number]  Chief Complaint  Patient presents with  . Back Pain    HPI Patient is in today for hip and leg pain.  she has been taking tylenol 1000 mg every 6 hours for months now.  Usually takes about 4 tabs per day.  She also has low back pain.  Says stretching makes it works or getting up after sitting for awhile.  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  Times it radiates down into both legs.  But her right leg is the side that she tends to get some numbness and tingling in some of the toes.  She also uses an over-the-counter rub which helps some.  She was seen for similar symptoms by 1 of my sports medicine in June 2019.  Recommendation at the time was for prednisone and more formal physical therapy.  She was unable to afford the physical therapy suggested some home exercises.  She says pretty much since then she has had persistent low back pain with pain going down into her legs.  She did have a lumbar x-ray done May 2019 most year and a half ago showing some mild degenerative changes of the discs particularly at L5-S1 with some osteophyte formation.  There was also some degenerative changes seen in the left-sided posterior facets at L4-5 and L5-S1.  Impaired fasting glucose-no increased thirst or urination. No symptoms consistent with hypoglycemia.   Past Medical History:  Diagnosis Date  . Abnormal Pap smear of cervix   . GERD (gastroesophageal reflux disease)   . Hypertension   . Impaired fasting glucose   . Obesity   . Prediabetes 04/11/2018    Past Surgical History:  Procedure Laterality Date  . APPENDECTOMY    . fallopian cyst    . removal cysts     on fallopian tubes    Family History  Problem Relation Age of Onset  . Stroke Mother   . Cancer Mother   . Alcohol abuse Father   . Hypertension Father   . Hypertension Brother   . Hypertension Brother    . Diabetes Unknown        grandparents, aunts    Social History   Socioeconomic History  . Marital status: Married    Spouse name: Not on file  . Number of children: Not on file  . Years of education: Not on file  . Highest education level: Not on file  Occupational History  . Not on file  Social Needs  . Financial resource strain: Not on file  . Food insecurity    Worry: Not on file    Inability: Not on file  . Transportation needs    Medical: Not on file    Non-medical: Not on file  Tobacco Use  . Smoking status: Never Smoker  . Smokeless tobacco: Never Used  Substance and Sexual Activity  . Alcohol use: No  . Drug use: No  . Sexual activity: Not Currently    Partners: Male    Birth control/protection: None    Comment: separated, 2 kids.  Lifestyle  . Physical activity    Days per week: Not on file    Minutes per session: Not on file  . Stress: Not on file  Relationships  . Social Musician on phone: Not on file    Gets together:  Not on file    Attends religious service: Not on file    Active member of club or organization: Not on file    Attends meetings of clubs or organizations: Not on file    Relationship status: Not on file  . Intimate partner violence    Fear of current or ex partner: Not on file    Emotionally abused: Not on file    Physically abused: Not on file    Forced sexual activity: Not on file  Other Topics Concern  . Not on file  Social History Narrative  . Not on file    Outpatient Medications Prior to Visit  Medication Sig Dispense Refill  . lisinopril-hydrochlorothiazide (PRINZIDE,ZESTORETIC) 20-25 MG tablet Take 1 tablet by mouth daily. 90 tablet 3  . triamcinolone cream (KENALOG) 0.5 % Apply 1 application topically 2 (two) times daily. To affected areas. 30 g 3  . esomeprazole (NEXIUM) 40 MG capsule TAKE 1 CAPSULE (40 MG TOTAL) BY MOUTH AT BEDTIME. NEEDS APPOINTMENT 15 capsule 0  . metroNIDAZOLE (FLAGYL) 500 MG tablet  Take 1 tablet (500 mg total) by mouth 2 (two) times daily. 14 tablet 0   No facility-administered medications prior to visit.     Allergies  Allergen Reactions  . Azithromycin     After starting had nausea, vomiting, rash.     ROS     Objective:    Physical Exam  Constitutional: She is oriented to person, place, and time. She appears well-developed and well-nourished.  HENT:  Head: Normocephalic and atraumatic.  Cardiovascular: Normal rate, regular rhythm and normal heart sounds.  Pulmonary/Chest: Effort normal and breath sounds normal.  Musculoskeletal:     Comments: Lumbar flexion, extension, rotation right and left.  Negative straight leg raise bilaterally though she did say her right leg felt tight when she straightened it.  Hip, knee, ankle strength is 5-5.  Patellar reflexes 1+ bilaterally.  Nontender over the lumbar spine but she is tender just lateral to the spine on the right side over the lumbar area.  Neurological: She is alert and oriented to person, place, and time.  Skin: Skin is warm and dry.  Psychiatric: She has a normal mood and affect. Her behavior is normal.    BP 128/78   Pulse 77   Ht 5\' 4"  (1.626 m)   Wt 193 lb (87.5 kg)   SpO2 99%   BMI 33.13 kg/m  Wt Readings from Last 3 Encounters:  09/01/19 193 lb (87.5 kg)  10/28/18 178 lb (80.7 kg)  10/14/18 177 lb (80.3 kg)    There are no preventive care reminders to display for this patient.  There are no preventive care reminders to display for this patient.   Lab Results  Component Value Date   TSH 1.68 07/10/2016   Lab Results  Component Value Date   WBC 9.0 09/30/2018   HGB 15.0 09/30/2018   HCT 43.6 09/30/2018   MCV 85.8 09/30/2018   PLT 363 09/30/2018   Lab Results  Component Value Date   NA 139 10/14/2018   K 4.1 10/14/2018   CO2 29 10/14/2018   GLUCOSE 94 10/14/2018   BUN 17 10/14/2018   CREATININE 0.94 10/14/2018   BILITOT 0.4 04/10/2018   ALKPHOS 70 07/10/2016   AST 17  04/10/2018   ALT 16 04/10/2018   PROT 6.8 04/10/2018   ALBUMIN 4.2 07/10/2016   CALCIUM 9.8 10/14/2018   Lab Results  Component Value Date   CHOL 179 04/10/2018  Lab Results  Component Value Date   HDL 59 04/10/2018   Lab Results  Component Value Date   LDLCALC 103 (H) 04/10/2018   Lab Results  Component Value Date   TRIG 77 04/10/2018   Lab Results  Component Value Date   CHOLHDL 3.0 04/10/2018   Lab Results  Component Value Date   HGBA1C 5.7 (A) 10/14/2018       Assessment & Plan:   Problem List Items Addressed This Visit      Endocrine   IMPAIRED FASTING GLUCOSE    Due for A1C today.       Relevant Orders   COMPLETE METABOLIC PANEL WITH GFR   Hemoglobin A1c    Other Visit Diagnoses    Needs flu shot    -  Primary   Need for Tdap vaccination       Relevant Orders   Tdap vaccine greater than or equal to 7yo IM (Completed)   Chronic bilateral low back pain with sciatica, sciatica laterality unspecified       Relevant Medications   DULoxetine (CYMBALTA) 30 MG capsule   Other Relevant Orders   COMPLETE METABOLIC PANEL WITH GFR   Pain in both lower extremities       Relevant Orders   TSH   B12   Ferritin   CBC with Differential/Platelet   Calf cramp          Chronic bilateral low back pain with radiculopathy to the right-we discussed options.  She was unable to afford physical formal physical therapy but did do some stretches on her own.  When I give her a new copy of updated exercises.  We also discussed possibly chiropractic care which she is open to encouraged her to check with her insurance on coverage.  We also discussed starting something like Cymbalta which can reduce pain by about 30% and is pretty effective and well-tolerated.  We will start this medication and plan to follow-up in 6 weeks.  If not improving then consider further work-up with MRI for possible interventional injections if needed.  At this point she is not interested in anything  that would be expensive or surgery.  Pain in lower extremities-could be related to the bilateral low back pain but she feels like it somewhat still a bit different and she is been getting some more cramping in that right calf.  Will check thyroid, B12 ferritin and a CBC.  Cramping-recommend a trial of over-the-counter B6.  Also discussed that shoewear can really affect calf cramping and to make sure she is wearing good supportive shoes to work.  Meds ordered this encounter  Medications  . DULoxetine (CYMBALTA) 30 MG capsule    Sig: Take 1 capsule (30 mg total) by mouth daily.    Dispense:  30 capsule    Refill:  1     Nani Gasseratherine Jillien Yakel, MD

## 2019-09-02 LAB — CBC WITH DIFFERENTIAL/PLATELET
Absolute Monocytes: 480 cells/uL (ref 200–950)
Basophils Absolute: 58 cells/uL (ref 0–200)
Basophils Relative: 0.9 %
Eosinophils Absolute: 160 cells/uL (ref 15–500)
Eosinophils Relative: 2.5 %
HCT: 43.3 % (ref 35.0–45.0)
Hemoglobin: 14.4 g/dL (ref 11.7–15.5)
Lymphs Abs: 1542 cells/uL (ref 850–3900)
MCH: 29.3 pg (ref 27.0–33.0)
MCHC: 33.3 g/dL (ref 32.0–36.0)
MCV: 88.2 fL (ref 80.0–100.0)
MPV: 10.8 fL (ref 7.5–12.5)
Monocytes Relative: 7.5 %
Neutro Abs: 4160 cells/uL (ref 1500–7800)
Neutrophils Relative %: 65 %
Platelets: 296 10*3/uL (ref 140–400)
RBC: 4.91 10*6/uL (ref 3.80–5.10)
RDW: 12.1 % (ref 11.0–15.0)
Total Lymphocyte: 24.1 %
WBC: 6.4 10*3/uL (ref 3.8–10.8)

## 2019-09-02 LAB — COMPLETE METABOLIC PANEL WITH GFR
AG Ratio: 1.5 (calc) (ref 1.0–2.5)
ALT: 22 U/L (ref 6–29)
AST: 20 U/L (ref 10–35)
Albumin: 4.3 g/dL (ref 3.6–5.1)
Alkaline phosphatase (APISO): 77 U/L (ref 37–153)
BUN: 16 mg/dL (ref 7–25)
CO2: 26 mmol/L (ref 20–32)
Calcium: 9.6 mg/dL (ref 8.6–10.4)
Chloride: 103 mmol/L (ref 98–110)
Creat: 0.94 mg/dL (ref 0.50–1.05)
GFR, Est African American: 78 mL/min/{1.73_m2} (ref >=60)
GFR, Est Non African American: 67 mL/min/{1.73_m2} (ref >=60)
Globulin: 2.8 g/dL (calc) (ref 1.9–3.7)
Glucose, Bld: 98 mg/dL (ref 65–139)
Potassium: 4.4 mmol/L (ref 3.5–5.3)
Sodium: 139 mmol/L (ref 135–146)
Total Bilirubin: 0.3 mg/dL (ref 0.2–1.2)
Total Protein: 7.1 g/dL (ref 6.1–8.1)

## 2019-09-02 LAB — HEMOGLOBIN A1C
Hgb A1c MFr Bld: 5.6 % of total Hgb (ref <5.7)
Mean Plasma Glucose: 114 (calc)
eAG (mmol/L): 6.3 (calc)

## 2019-09-02 LAB — TSH: TSH: 1.43 mIU/L (ref 0.40–4.50)

## 2019-09-02 LAB — FERRITIN: Ferritin: 55 ng/mL (ref 16–232)

## 2019-09-02 LAB — VITAMIN B12: Vitamin B-12: 832 pg/mL (ref 200–1100)

## 2019-09-26 IMAGING — DX DG LUMBAR SPINE COMPLETE 4+V
5 series · 5 of 5 positions shown · non-contrast
Comparison: None.

CLINICAL DATA: Right lower extremity pain for 5 months without
known injury.

EXAM:
LUMBAR SPINE - COMPLETE 4+ VIEW

[l-spine ap]
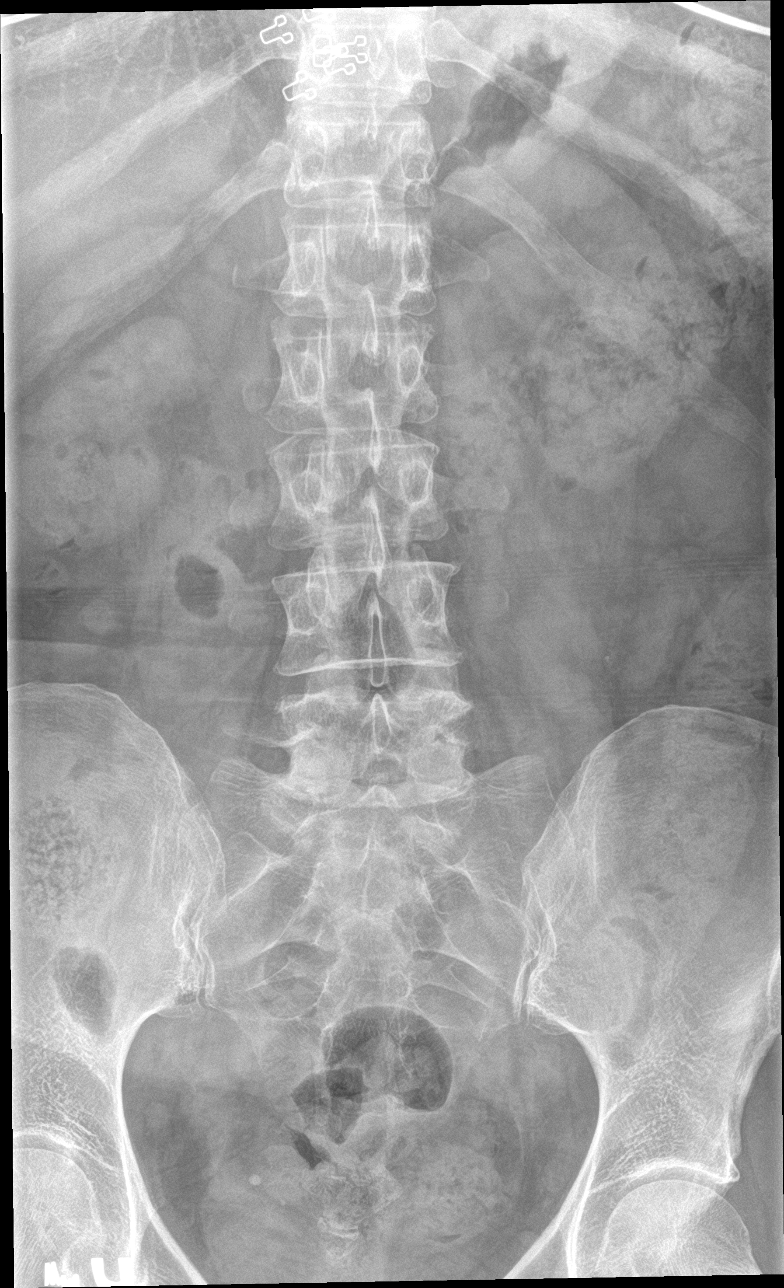

[l-spine obl (1 of 2)]
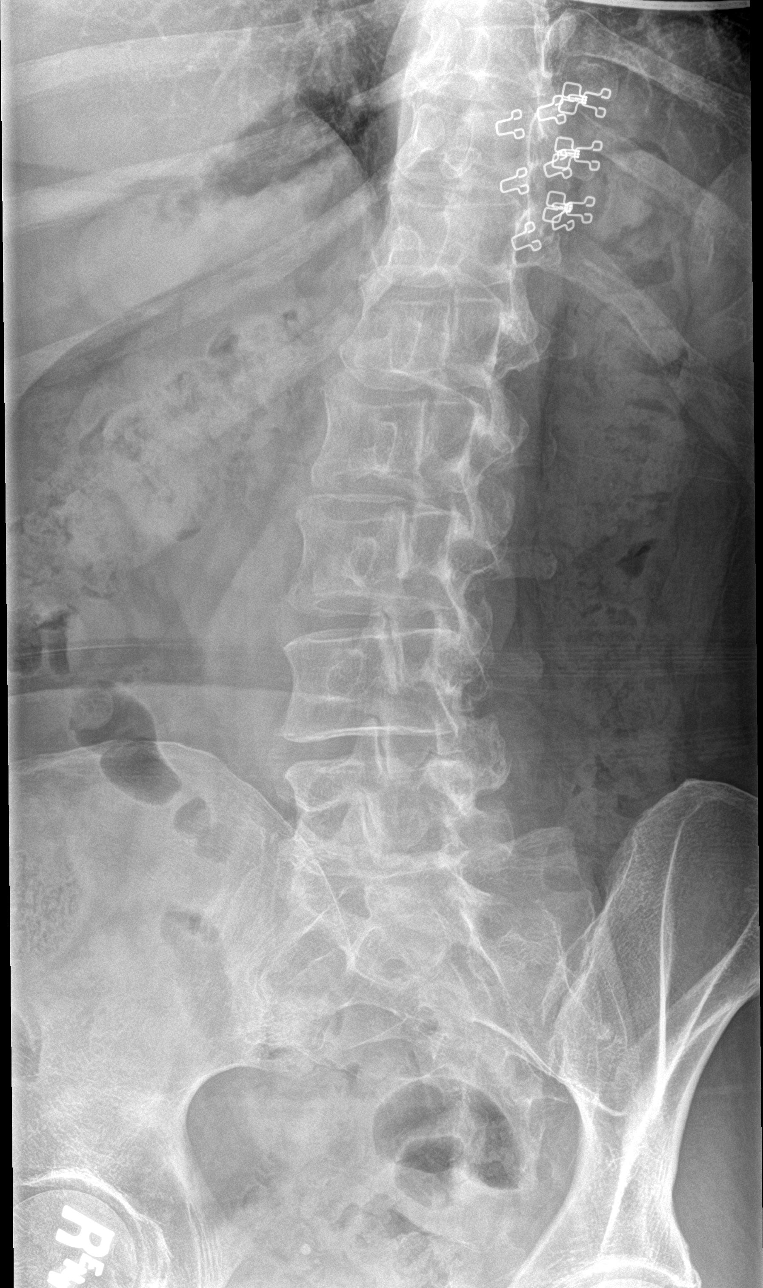

[l-spine obl (2 of 2)]
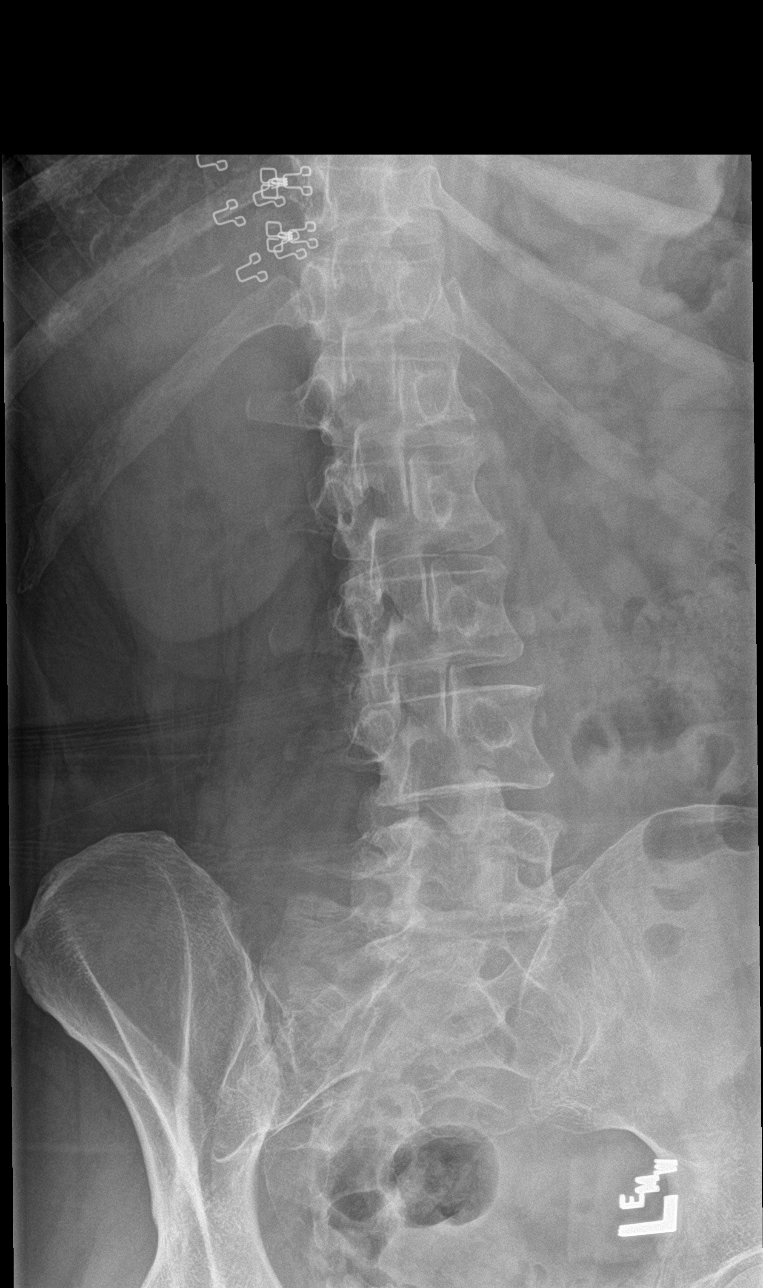

[l-spine lat]
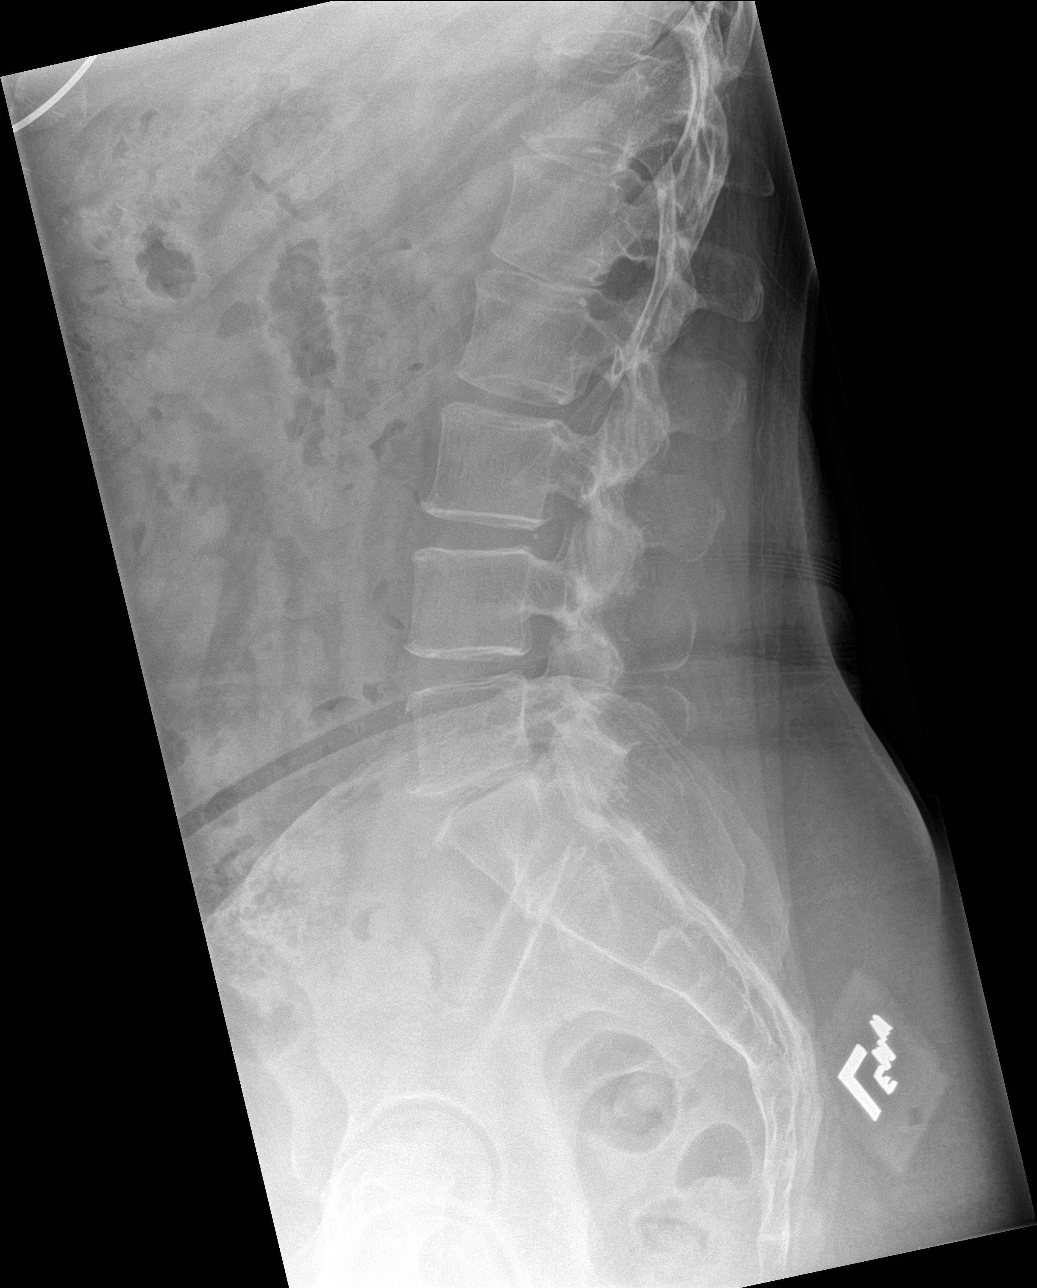

[l-spine spot]
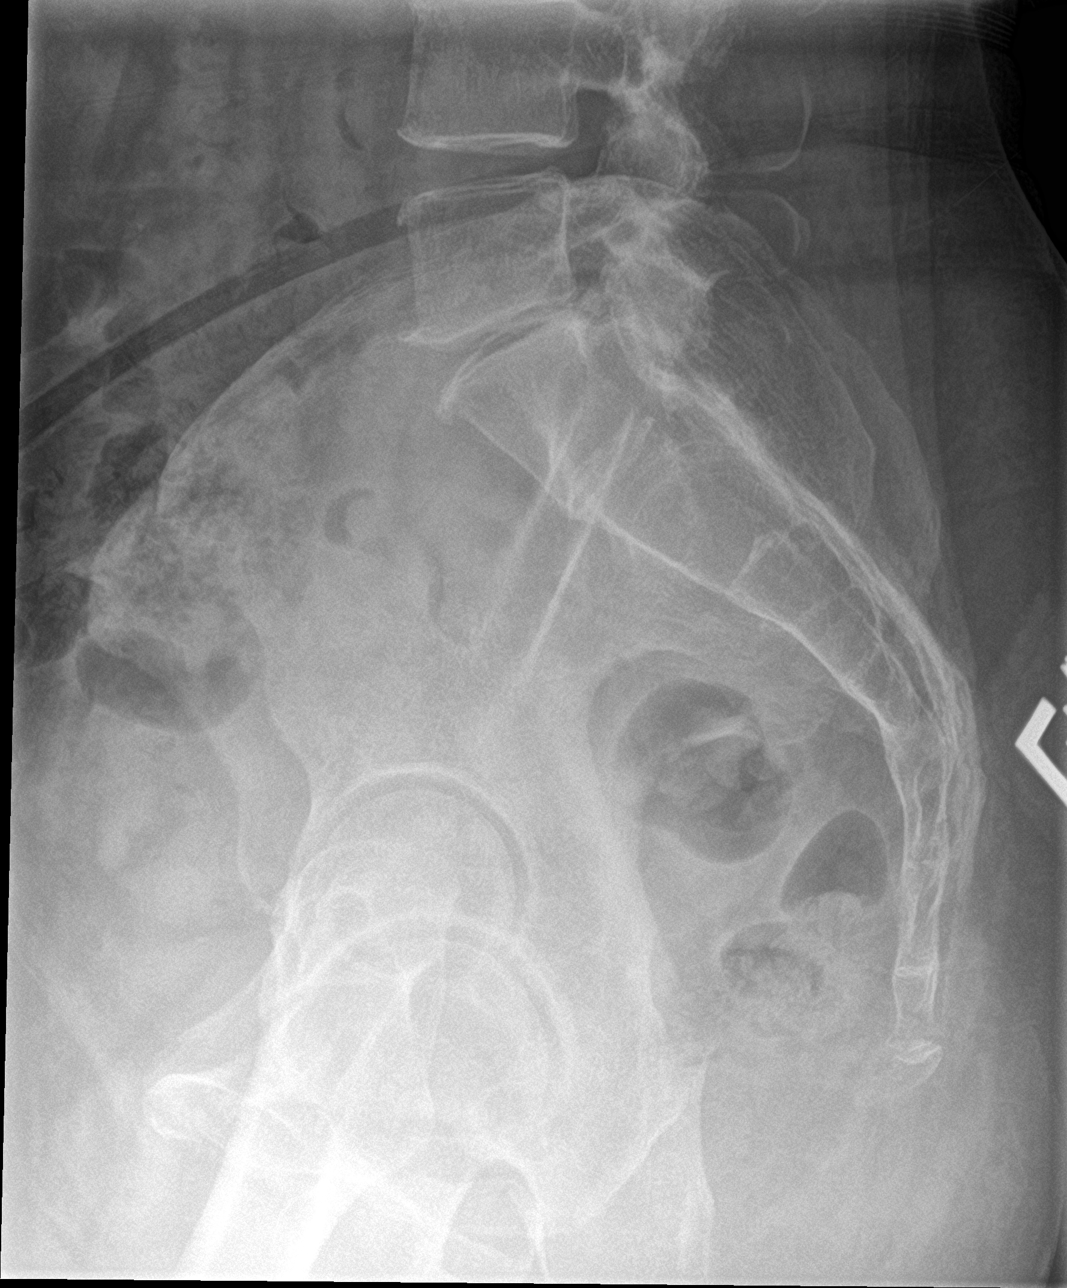

[5 of 5 positions shown; findings below may reference images not displayed]

FINDINGS: No fracture or spondylolisthesis is noted. Mild degenerative disc
disease is noted at L5-S1 with anterior osteophyte formation.
Remaining disc spaces are intact. Degenerative changes are seen
involving the left-sided posterior facet joints of L4-5 and L5-S1.
IMPRESSION: Mild degenerative changes as described above. No acute abnormality
seen in the lumbar spine.

## 2019-10-13 ENCOUNTER — Ambulatory Visit: Payer: BLUE CROSS/BLUE SHIELD | Admitting: Family Medicine

## 2019-11-02 ENCOUNTER — Other Ambulatory Visit: Payer: Self-pay | Admitting: Family Medicine

## 2019-11-02 DIAGNOSIS — G8929 Other chronic pain: Secondary | ICD-10-CM

## 2020-01-23 ENCOUNTER — Encounter: Payer: Self-pay | Admitting: Family Medicine

## 2020-01-23 ENCOUNTER — Other Ambulatory Visit: Payer: Self-pay

## 2020-01-23 ENCOUNTER — Other Ambulatory Visit: Payer: Self-pay | Admitting: *Deleted

## 2020-01-23 ENCOUNTER — Telehealth: Payer: Self-pay | Admitting: Family Medicine

## 2020-01-23 ENCOUNTER — Ambulatory Visit (INDEPENDENT_AMBULATORY_CARE_PROVIDER_SITE_OTHER): Payer: BLUE CROSS/BLUE SHIELD | Admitting: Family Medicine

## 2020-01-23 VITALS — BP 109/59 | HR 81 | Ht 64.0 in | Wt 184.0 lb

## 2020-01-23 DIAGNOSIS — H9203 Otalgia, bilateral: Secondary | ICD-10-CM

## 2020-01-23 DIAGNOSIS — J019 Acute sinusitis, unspecified: Secondary | ICD-10-CM

## 2020-01-23 DIAGNOSIS — H65191 Other acute nonsuppurative otitis media, right ear: Secondary | ICD-10-CM

## 2020-01-23 DIAGNOSIS — R430 Anosmia: Secondary | ICD-10-CM | POA: Diagnosis not present

## 2020-01-23 MED ORDER — AMOXICILLIN-POT CLAVULANATE 875-125 MG PO TABS: 1.0000 | ORAL_TABLET | Freq: Two times a day (BID) | ORAL | 0 refills | Status: DC

## 2020-01-23 NOTE — Progress Notes (Signed)
Acute Office Visit  Subjective:    Patient ID: Amanda Andrews, female    DOB: 1961/02/15, 59 y.o.   MRN: [id number]  Chief Complaint  Patient presents with  . Sinusitis  . Ear Pain    HPI Patient is in today for sinus issues and ear ache.  Patient complains of sinus congestion cough and sore throat for about 2-1/2 weeks.  Starting on Saturday of last week so approximately 7 days ago she lost her sense of taste and smell.  At that point she actually had a rapid test done through work that was negative.  She has been back at work but decided to come in today because she was still just not feeling well.  She never experienced any fevers or chills during this episode.  She still has some congestion in her upper chest and intermittent sore throat.  She also is starting to get some pressure in both of her ears.  Little bit worse on the right compared to the left.   Past Medical History:  Diagnosis Date  . Abnormal Pap smear of cervix   . GERD (gastroesophageal reflux disease)   . Hypertension   . Impaired fasting glucose   . Obesity   . Prediabetes 04/11/2018    Past Surgical History:  Procedure Laterality Date  . APPENDECTOMY    . fallopian cyst    . removal cysts     on fallopian tubes    Family History  Problem Relation Age of Onset  . Stroke Mother   . Cancer Mother   . Alcohol abuse Father   . Hypertension Father   . Hypertension Brother   . Hypertension Brother   . Diabetes Unknown        grandparents, aunts    Social History   Socioeconomic History  . Marital status: Married    Spouse name: Not on file  . Number of children: Not on file  . Years of education: Not on file  . Highest education level: Not on file  Occupational History  . Not on file  Tobacco Use  . Smoking status: Never Smoker  . Smokeless tobacco: Never Used  Substance and Sexual Activity  . Alcohol use: No  . Drug use: No  . Sexual activity: Not Currently    Partners: Male    Birth  control/protection: None    Comment: separated, 2 kids.  Other Topics Concern  . Not on file  Social History Narrative  . Not on file   Social Determinants of Health   Financial Resource Strain:   . Difficulty of Paying Living Expenses: Not on file  Food Insecurity:   . Worried About Programme researcher, broadcasting/film/video in the Last Year: Not on file  . Ran Out of Food in the Last Year: Not on file  Transportation Needs:   . Lack of Transportation (Medical): Not on file  . Lack of Transportation (Non-Medical): Not on file  Physical Activity:   . Days of Exercise per Week: Not on file  . Minutes of Exercise per Session: Not on file  Stress:   . Feeling of Stress : Not on file  Social Connections:   . Frequency of Communication with Friends and Family: Not on file  . Frequency of Social Gatherings with Friends and Family: Not on file  . Attends Religious Services: Not on file  . Active Member of Clubs or Organizations: Not on file  . Attends Banker Meetings: Not on file  .  Marital Status: Not on file  Intimate Partner Violence:   . Fear of Current or Ex-Partner: Not on file  . Emotionally Abused: Not on file  . Physically Abused: Not on file  . Sexually Abused: Not on file    Outpatient Medications Prior to Visit  Medication Sig Dispense Refill  . lisinopril-hydrochlorothiazide (ZESTORETIC) 20-25 MG tablet TAKE 1 TABLET BY MOUTH EVERY DAY 90 tablet 3  . DULoxetine (CYMBALTA) 30 MG capsule TAKE 1 CAPSULE BY MOUTH EVERY DAY 60 capsule 0  . triamcinolone cream (KENALOG) 0.5 % Apply 1 application topically 2 (two) times daily. To affected areas. 30 g 3   No facility-administered medications prior to visit.    Allergies  Allergen Reactions  . Azithromycin     After starting had nausea, vomiting, rash.     Review of Systems     Objective:    Physical Exam Constitutional:      Appearance: She is well-developed.  HENT:     Head: Normocephalic and atraumatic.     Right  Ear: Tympanic membrane, ear canal and external ear normal.     Left Ear: Tympanic membrane, ear canal and external ear normal.     Ears:     Comments: She does have some fluid behind the right tympanic membrane at the 6 o'clock position.  No erythema.    Nose: Nose normal.     Mouth/Throat:     Mouth: Mucous membranes are dry.     Pharynx: Oropharynx is clear.     Comments: Very mild erythema of the posterior pharynx. Eyes:     Conjunctiva/sclera: Conjunctivae normal.     Pupils: Pupils are equal, round, and reactive to light.  Neck:     Thyroid: No thyromegaly.  Cardiovascular:     Rate and Rhythm: Normal rate and regular rhythm.     Heart sounds: Normal heart sounds.  Pulmonary:     Effort: Pulmonary effort is normal.     Breath sounds: Normal breath sounds. No wheezing.  Musculoskeletal:     Cervical back: Neck supple.  Lymphadenopathy:     Cervical: No cervical adenopathy.  Skin:    General: Skin is warm and dry.  Neurological:     Mental Status: She is alert and oriented to person, place, and time.     BP (!) 109/59   Pulse 81   Ht 5\' 4"  (1.626 m)   Wt 184 lb (83.5 kg)   SpO2 98%   BMI 31.58 kg/m  Wt Readings from Last 3 Encounters:  01/23/20 184 lb (83.5 kg)  09/01/19 193 lb (87.5 kg)  10/28/18 178 lb (80.7 kg)    Health Maintenance Due  Topic Date Due  . COLONOSCOPY  01/26/2011    There are no preventive care reminders to display for this patient.   Lab Results  Component Value Date   TSH 1.43 09/01/2019   Lab Results  Component Value Date   WBC 6.4 09/01/2019   HGB 14.4 09/01/2019   HCT 43.3 09/01/2019   MCV 88.2 09/01/2019   PLT 296 09/01/2019   Lab Results  Component Value Date   NA 139 09/01/2019   K 4.4 09/01/2019   CO2 26 09/01/2019   GLUCOSE 98 09/01/2019   BUN 16 09/01/2019   CREATININE 0.94 09/01/2019   BILITOT 0.3 09/01/2019   ALKPHOS 70 07/10/2016   AST 20 09/01/2019   ALT 22 09/01/2019   PROT 7.1 09/01/2019   ALBUMIN 4.2  07/10/2016  CALCIUM 9.6 09/01/2019   Lab Results  Component Value Date   CHOL 179 04/10/2018   Lab Results  Component Value Date   HDL 59 04/10/2018   Lab Results  Component Value Date   LDLCALC 103 (H) 04/10/2018   Lab Results  Component Value Date   TRIG 77 04/10/2018   Lab Results  Component Value Date   CHOLHDL 3.0 04/10/2018   Lab Results  Component Value Date   HGBA1C 5.6 09/01/2019       Assessment & Plan:   Problem List Items Addressed This Visit    None    Visit Diagnoses    Acute non-recurrent sinusitis, unspecified location    -  Primary   Relevant Medications   amoxicillin-clavulanate (AUGMENTIN) 875-125 MG tablet   Ear pain, bilateral       Acute middle ear effusion, right       Relevant Medications   amoxicillin-clavulanate (AUGMENTIN) 875-125 MG tablet   Loss of smell       Relevant Orders   Novel Coronavirus, NAA ([facility])      Acute sinusitis with symptoms x2 and half weeks with an acute right middle ear effusion-we will treat with Augmentin based on allergies.  Okay to continue any Tylenol or ibuprofen for pain relief.  loss of taste or smell-recommend PCR Covid test even though her initial rapid was negative.  Return for any new or worsening symptoms.   Meds ordered this encounter  Medications  . amoxicillin-clavulanate (AUGMENTIN) 875-125 MG tablet    Sig: Take 1 tablet by mouth 2 (two) times daily.    Dispense:  20 tablet    Refill:  0     Beatrice Lecher, MD

## 2020-01-23 NOTE — Telephone Encounter (Signed)
Spoke w/pt and she denies any f/s/c/n/v/d. She stated that she had a rapid COVID test done and this was negative. She stated that for the past week her ears have really felt like they are more stopped up.   Pt advised to check in at 250 PM today.Laureen Ochs, Viann Shove, CMA

## 2020-01-23 NOTE — Telephone Encounter (Signed)
PT requested to come into the office for an ear ache(also has sinus issues).  Has an appointment today at 3pm.   Please advise.

## 2020-01-25 LAB — NOVEL CORONAVIRUS, NAA: SARS-CoV-2, NAA: NOT DETECTED

## 2020-04-14 ENCOUNTER — Telehealth: Payer: Self-pay | Admitting: Family Medicine

## 2020-04-14 NOTE — Telephone Encounter (Signed)
Please call patient and encouraged her to consider colon cancer screening.  I do not have any prior screening on file and she is now 79.  We could even do stool cards or Cologuard if she does not want to do a scope.  Please just let me know.

## 2020-04-20 NOTE — Telephone Encounter (Signed)
lvm asking for rtn call or for her to sent my chart as to how she would like to proceed w/Colon Ca screening.

## 2020-04-21 NOTE — Telephone Encounter (Signed)
LVM asking that she either call back or send my chart as to how she would like to proceed

## 2020-04-23 ENCOUNTER — Encounter: Payer: Self-pay | Admitting: *Deleted

## 2020-04-23 NOTE — Telephone Encounter (Signed)
Sent letter

## 2020-12-14 ENCOUNTER — Other Ambulatory Visit: Payer: Self-pay | Admitting: Family Medicine

## 2020-12-16 NOTE — Telephone Encounter (Signed)
Left patient a VM to schedule a follow up appt. AM

## 2021-02-21 ENCOUNTER — Other Ambulatory Visit: Payer: Self-pay

## 2021-02-21 MED ORDER — LISINOPRIL-HYDROCHLOROTHIAZIDE 20-25 MG PO TABS: 1.0000 | ORAL_TABLET | Freq: Every day | ORAL | 0 refills | Status: DC

## 2021-03-21 ENCOUNTER — Other Ambulatory Visit: Payer: Self-pay

## 2021-03-21 ENCOUNTER — Ambulatory Visit (INDEPENDENT_AMBULATORY_CARE_PROVIDER_SITE_OTHER): Payer: BLUE CROSS/BLUE SHIELD | Admitting: Family Medicine

## 2021-03-21 ENCOUNTER — Encounter: Payer: Self-pay | Admitting: Family Medicine

## 2021-03-21 VITALS — BP 130/60 | HR 58 | Ht 64.0 in | Wt 177.0 lb

## 2021-03-21 DIAGNOSIS — R7301 Impaired fasting glucose: Secondary | ICD-10-CM | POA: Diagnosis not present

## 2021-03-21 DIAGNOSIS — I10 Essential (primary) hypertension: Secondary | ICD-10-CM | POA: Diagnosis not present

## 2021-03-21 DIAGNOSIS — Z1211 Encounter for screening for malignant neoplasm of colon: Secondary | ICD-10-CM

## 2021-03-21 DIAGNOSIS — Z1231 Encounter for screening mammogram for malignant neoplasm of breast: Secondary | ICD-10-CM | POA: Diagnosis not present

## 2021-03-21 MED ORDER — LISINOPRIL-HYDROCHLOROTHIAZIDE 20-25 MG PO TABS: 1.0000 | ORAL_TABLET | Freq: Every day | ORAL | 1 refills | Status: DC

## 2021-03-21 NOTE — Assessment & Plan Note (Signed)
Well controlled. Continue current regimen. Follow up in  6 mo  

## 2021-03-21 NOTE — Progress Notes (Signed)
Established Patient Office Visit  Subjective:  Patient ID: AYZIA DAY, female    DOB: November 07, 1961  Age: 60 y.o. MRN: [id number]  CC:  Chief Complaint  Patient presents with  . Hypertension    HPI Amanda Andrews presents for   Hypertension- Pt denies chest pain, SOB, dizziness, or heart palpitations.  Taking meds as directed w/o problems.  Denies medication side effects.   Impaired fasting glucose-no increased thirst or urination. No symptoms consistent with hypoglycemia.   Past Medical History:  Diagnosis Date  . Abnormal Pap smear of cervix   . GERD (gastroesophageal reflux disease)   . Hypertension   . Impaired fasting glucose   . Obesity   . Prediabetes 04/11/2018    Past Surgical History:  Procedure Laterality Date  . APPENDECTOMY    . fallopian cyst    . removal cysts     on fallopian tubes    Family History  Problem Relation Age of Onset  . Stroke Mother   . Cancer Mother   . Alcohol abuse Father   . Hypertension Father   . Hypertension Brother   . Hypertension Brother   . Diabetes Unknown        grandparents, aunts    Social History   Socioeconomic History  . Marital status: Married    Spouse name: Not on file  . Number of children: Not on file  . Years of education: Not on file  . Highest education level: Not on file  Occupational History  . Not on file  Tobacco Use  . Smoking status: Never Smoker  . Smokeless tobacco: Never Used  Vaping Use  . Vaping Use: Never used  Substance and Sexual Activity  . Alcohol use: No  . Drug use: No  . Sexual activity: Not Currently    Partners: Male    Birth control/protection: None    Comment: separated, 2 kids.  Other Topics Concern  . Not on file  Social History Narrative  . Not on file   Social Determinants of Health   Financial Resource Strain: Not on file  Food Insecurity: Not on file  Transportation Needs: Not on file  Physical Activity: Not on file  Stress: Not on file  Social  Connections: Not on file  Intimate Partner Violence: Not on file    Outpatient Medications Prior to Visit  Medication Sig Dispense Refill  . lisinopril-hydrochlorothiazide (ZESTORETIC) 20-25 MG tablet Take 1 tablet by mouth daily. Must keep appointment. 30 tablet 0   No facility-administered medications prior to visit.    Allergies  Allergen Reactions  . Azithromycin     After starting had nausea, vomiting, rash.     ROS Review of Systems    Objective:    Physical Exam Constitutional:      Appearance: Amanda Andrews is well-developed.  HENT:     Head: Normocephalic and atraumatic.  Cardiovascular:     Rate and Rhythm: Normal rate and regular rhythm.     Heart sounds: Normal heart sounds.  Pulmonary:     Effort: Pulmonary effort is normal.     Breath sounds: Normal breath sounds.  Skin:    General: Skin is warm and dry.  Neurological:     Mental Status: Amanda Andrews is alert and oriented to person, place, and time.  Psychiatric:        Behavior: Behavior normal.     BP 130/60   Pulse (!) 58   Ht 5\' 4"  (1.626 m)   Wt  177 lb (80.3 kg)   SpO2 98%   BMI 30.38 kg/m  Wt Readings from Last 3 Encounters:  03/21/21 177 lb (80.3 kg)  01/23/20 184 lb (83.5 kg)  09/01/19 193 lb (87.5 kg)     Health Maintenance Due  Topic Date Due  . MAMMOGRAM  04/26/2020    There are no preventive care reminders to display for this patient.  Lab Results  Component Value Date   TSH 1.43 09/01/2019   Lab Results  Component Value Date   WBC 6.4 09/01/2019   HGB 14.4 09/01/2019   HCT 43.3 09/01/2019   MCV 88.2 09/01/2019   PLT 296 09/01/2019   Lab Results  Component Value Date   NA 139 09/01/2019   K 4.4 09/01/2019   CO2 26 09/01/2019   GLUCOSE 98 09/01/2019   BUN 16 09/01/2019   CREATININE 0.94 09/01/2019   BILITOT 0.3 09/01/2019   ALKPHOS 70 07/10/2016   AST 20 09/01/2019   ALT 22 09/01/2019   PROT 7.1 09/01/2019   ALBUMIN 4.2 07/10/2016   CALCIUM 9.6 09/01/2019   Lab Results   Component Value Date   CHOL 179 04/10/2018   Lab Results  Component Value Date   HDL 59 04/10/2018   Lab Results  Component Value Date   LDLCALC 103 (H) 04/10/2018   Lab Results  Component Value Date   TRIG 77 04/10/2018   Lab Results  Component Value Date   CHOLHDL 3.0 04/10/2018   Lab Results  Component Value Date   HGBA1C 5.6 09/01/2019      Assessment & Plan:   Problem List Items Addressed This Visit      Cardiovascular and Mediastinum   HYPERTENSION, BENIGN ESSENTIAL - Primary    Well controlled. Continue current regimen. Follow up in  67mo       Relevant Medications   lisinopril-hydrochlorothiazide (ZESTORETIC) 20-25 MG tablet   Other Relevant Orders   COMPLETE METABOLIC PANEL WITH GFR   Lipid Panel w/reflex Direct LDL   CBC with Differential   Hemoglobin A1c     Endocrine   IMPAIRED FASTING GLUCOSE    Well controlled. Continue current regimen. Follow up in  6 mo        Other Visit Diagnoses    IFG (impaired fasting glucose)       Relevant Orders   COMPLETE METABOLIC PANEL WITH GFR   Lipid Panel w/reflex Direct LDL   CBC with Differential   Hemoglobin A1c   Screening mammogram for breast cancer       Relevant Orders   MM 3D SCREEN BREAST BILATERAL   Screen for colon cancer       Relevant Orders   Ambulatory referral to Gastroenterology     Did encourage her to schedule her mammogram.  Also discussed colon cancer screening.  We will go ahead and place referral.  Amanda Andrews is okay with moving forward with colonoscopy.  Meds ordered this encounter  Medications  . lisinopril-hydrochlorothiazide (ZESTORETIC) 20-25 MG tablet    Sig: Take 1 tablet by mouth daily.    Dispense:  90 tablet    Refill:  1    Follow-up: Return in about 6 months (around 09/21/2021) for Hypertension.    Nani Gasser, MD

## 2021-03-22 LAB — CBC WITH DIFFERENTIAL/PLATELET
Absolute Monocytes: 456 cells/uL (ref 200–950)
Basophils Absolute: 48 cells/uL (ref 0–200)
Basophils Relative: 0.8 %
Eosinophils Absolute: 120 cells/uL (ref 15–500)
Eosinophils Relative: 2 %
HCT: 43.7 % (ref 35.0–45.0)
Hemoglobin: 14.5 g/dL (ref 11.7–15.5)
Lymphs Abs: 1518 cells/uL (ref 850–3900)
MCH: 29.3 pg (ref 27.0–33.0)
MCHC: 33.2 g/dL (ref 32.0–36.0)
MCV: 88.3 fL (ref 80.0–100.0)
MPV: 10.4 fL (ref 7.5–12.5)
Monocytes Relative: 7.6 %
Neutro Abs: 3858 cells/uL (ref 1500–7800)
Neutrophils Relative %: 64.3 %
Platelets: 265 10*3/uL (ref 140–400)
RBC: 4.95 10*6/uL (ref 3.80–5.10)
RDW: 12 % (ref 11.0–15.0)
Total Lymphocyte: 25.3 %
WBC: 6 10*3/uL (ref 3.8–10.8)

## 2021-03-22 LAB — HEMOGLOBIN A1C
Hgb A1c MFr Bld: 5.6 % of total Hgb (ref <5.7)
Mean Plasma Glucose: 114 mg/dL
eAG (mmol/L): 6.3 mmol/L

## 2021-03-22 LAB — LIPID PANEL W/REFLEX DIRECT LDL
Cholesterol: 193 mg/dL (ref <200)
HDL: 68 mg/dL (ref >=50)
LDL Cholesterol (Calc): 107 mg/dL (calc) — ABNORMAL HIGH
Non-HDL Cholesterol (Calc): 125 mg/dL (calc) (ref <130)
Total CHOL/HDL Ratio: 2.8 (calc) (ref <5.0)
Triglycerides: 88 mg/dL (ref <150)

## 2021-03-22 LAB — COMPLETE METABOLIC PANEL WITH GFR
AG Ratio: 1.5 (calc) (ref 1.0–2.5)
ALT: 17 U/L (ref 6–29)
AST: 20 U/L (ref 10–35)
Albumin: 4.3 g/dL (ref 3.6–5.1)
Alkaline phosphatase (APISO): 70 U/L (ref 37–153)
BUN: 16 mg/dL (ref 7–25)
CO2: 30 mmol/L (ref 20–32)
Calcium: 9.5 mg/dL (ref 8.6–10.4)
Chloride: 102 mmol/L (ref 98–110)
Creat: 0.87 mg/dL (ref 0.50–0.99)
GFR, Est African American: 84 mL/min/{1.73_m2} (ref >=60)
GFR, Est Non African American: 72 mL/min/{1.73_m2} (ref >=60)
Globulin: 2.9 g/dL (calc) (ref 1.9–3.7)
Glucose, Bld: 95 mg/dL (ref 65–99)
Potassium: 4.3 mmol/L (ref 3.5–5.3)
Sodium: 139 mmol/L (ref 135–146)
Total Bilirubin: 0.5 mg/dL (ref 0.2–1.2)
Total Protein: 7.2 g/dL (ref 6.1–8.1)

## 2021-11-06 ENCOUNTER — Other Ambulatory Visit: Payer: Self-pay | Admitting: Family Medicine

## 2021-11-06 DIAGNOSIS — I10 Essential (primary) hypertension: Secondary | ICD-10-CM

## 2021-11-07 NOTE — Telephone Encounter (Signed)
Please call pt and have her to schedule a f/u for bp and non fasting labs. 30 day supply sent

## 2021-11-07 NOTE — Telephone Encounter (Signed)
LVM for patient to call back to get this f/u appt scheduled with non fasting labs. AM

## 2021-11-08 NOTE — Telephone Encounter (Signed)
Left message on patients voicemail to return call to schedule follow up appointment.

## 2021-12-14 ENCOUNTER — Other Ambulatory Visit: Payer: Self-pay | Admitting: Family Medicine

## 2021-12-14 DIAGNOSIS — I10 Essential (primary) hypertension: Secondary | ICD-10-CM

## 2022-01-05 ENCOUNTER — Other Ambulatory Visit: Payer: Self-pay | Admitting: Family Medicine

## 2022-01-05 DIAGNOSIS — I10 Essential (primary) hypertension: Secondary | ICD-10-CM

## 2022-01-05 NOTE — Telephone Encounter (Signed)
One week blood pressure meds sent, needs appt. Please call.

## 2022-01-06 NOTE — Telephone Encounter (Signed)
LVM for patient to call back to schedule f/u visit with pcp & for further refills.

## 2022-01-25 ENCOUNTER — Telehealth: Payer: Self-pay | Admitting: Family Medicine

## 2022-01-25 NOTE — Telephone Encounter (Signed)
Please call patient remind her to schedule her mammogram.  If she goes somewhere else for it then please let us know so that we can get her chart updated. ?

## 2022-01-25 NOTE — Telephone Encounter (Signed)
LVM for pt to call to discuss.  T. Jesson Foskey, CMA  

## 2022-01-26 NOTE — Telephone Encounter (Signed)
LVM for pt to call to discuss.  T. Akela Pocius, CMA  

## 2022-01-27 NOTE — Telephone Encounter (Signed)
Unable to reach pt by phone.  MyChart message sent to pt with information.  Amanda Andrews, CMA ?

## 2022-02-22 ENCOUNTER — Other Ambulatory Visit: Payer: Self-pay | Admitting: Family Medicine

## 2022-02-22 DIAGNOSIS — I10 Essential (primary) hypertension: Secondary | ICD-10-CM

## 2022-03-03 ENCOUNTER — Other Ambulatory Visit: Payer: Self-pay | Admitting: Family Medicine

## 2022-03-03 DIAGNOSIS — I10 Essential (primary) hypertension: Secondary | ICD-10-CM

## 2022-03-03 NOTE — Telephone Encounter (Signed)
Please have pt schedule an appt for refills ?

## 2022-03-03 NOTE — Telephone Encounter (Signed)
LVM for patient to call back to get appt scheduled. AMuck ?

## 2022-04-03 ENCOUNTER — Ambulatory Visit: Payer: BLUE CROSS/BLUE SHIELD | Admitting: Family Medicine

## 2022-04-10 ENCOUNTER — Encounter: Payer: Self-pay | Admitting: Family Medicine

## 2022-04-10 ENCOUNTER — Ambulatory Visit (INDEPENDENT_AMBULATORY_CARE_PROVIDER_SITE_OTHER): Payer: BLUE CROSS/BLUE SHIELD | Admitting: Family Medicine

## 2022-04-10 VITALS — BP 133/63 | HR 68 | Ht 64.0 in | Wt 174.0 lb

## 2022-04-10 DIAGNOSIS — Z1211 Encounter for screening for malignant neoplasm of colon: Secondary | ICD-10-CM | POA: Diagnosis not present

## 2022-04-10 DIAGNOSIS — I10 Essential (primary) hypertension: Secondary | ICD-10-CM

## 2022-04-10 DIAGNOSIS — R7301 Impaired fasting glucose: Secondary | ICD-10-CM | POA: Diagnosis not present

## 2022-04-10 DIAGNOSIS — Z1231 Encounter for screening mammogram for malignant neoplasm of breast: Secondary | ICD-10-CM | POA: Diagnosis not present

## 2022-04-10 LAB — POCT GLYCOSYLATED HEMOGLOBIN (HGB A1C): Hemoglobin A1C: 5 % (ref 4.0–5.6)

## 2022-04-10 MED ORDER — LISINOPRIL-HYDROCHLOROTHIAZIDE 20-25 MG PO TABS: 1.0000 | ORAL_TABLET | Freq: Every day | ORAL | 3 refills | Status: DC

## 2022-04-10 NOTE — Assessment & Plan Note (Signed)
Her A1c looks absolutely phenomenal today.  She says she really has worked hard to change her diet.  She was hoping to lose a little bit more weight compared to last year but she is down about 3 pounds.

## 2022-04-10 NOTE — Assessment & Plan Note (Addendum)
Well controlled even though off her medicine for 3 days.  Discussed checking her blood pressure when she restarts the medication just to make sure that she does not have low blood pressures.. Continue current regimen. Follow up in  6 mo, due for updated lab work

## 2022-04-10 NOTE — Progress Notes (Signed)
Established Patient Office Visit  Subjective   Patient ID: Amanda Andrews, female    DOB: September 18, 1961  Age: 61 y.o. MRN: [id number]  Chief Complaint  Patient presents with   Hypertension    HPI   Hypertension- Pt denies chest pain, SOB, dizziness, or heart palpitations.  Taking meds as directed w/o problems.  Denies medication side effects.  She has been out of her blood pressure pill for about 3 days.  Impaired fasting glucose-no increased thirst or urination. No symptoms consistent with hypoglycemia.  Has really been working on her diet over the last year.  And she is down a few pounds.  Overdue for mammogram and Pap smear but is okay with going to get those done.  Would like to get in with a GYN for her Pap smears since it is better covered with her insurance plan.  We are technically out of network for her.    ROS    Objective:     BP 133/63   Pulse 68   Ht 5\' 4"  (1.626 m)   Wt 174 lb (78.9 kg)   SpO2 98%   BMI 29.87 kg/m    Physical Exam Vitals and nursing note reviewed.  Constitutional:      Appearance: She is well-developed.  HENT:     Head: Normocephalic and atraumatic.  Cardiovascular:     Rate and Rhythm: Normal rate and regular rhythm.     Heart sounds: Normal heart sounds.  Pulmonary:     Effort: Pulmonary effort is normal.     Breath sounds: Normal breath sounds.  Skin:    General: Skin is warm and dry.  Neurological:     Mental Status: She is alert and oriented to person, place, and time.  Psychiatric:        Behavior: Behavior normal.     No results found for any visits on 04/10/22.    The 10-year ASCVD risk score (Arnett DK, et al., 2019) is: 4.5%    Assessment & Plan:   Problem List Items Addressed This Visit       Cardiovascular and Mediastinum   HYPERTENSION, BENIGN ESSENTIAL - Primary    Well controlled even though off her medicine for 3 days.  Discussed checking her blood pressure when she restarts the medication just to  make sure that she does not have low blood pressures.. Continue current regimen. Follow up in  6 mo, due for updated lab work       Relevant Medications   lisinopril-hydrochlorothiazide (ZESTORETIC) 20-25 MG tablet   Other Relevant Orders   POCT glycosylated hemoglobin (Hb A1C)   COMPLETE METABOLIC PANEL WITH GFR   Lipid Panel w/reflex Direct LDL   TSH   CBC     Endocrine   IMPAIRED FASTING GLUCOSE    Her A1c looks absolutely phenomenal today.  She says she really has worked hard to change her diet.  She was hoping to lose a little bit more weight compared to last year but she is down about 3 pounds.       Other Visit Diagnoses     IFG (impaired fasting glucose)       Relevant Orders   POCT glycosylated hemoglobin (Hb A1C)   COMPLETE METABOLIC PANEL WITH GFR   Lipid Panel w/reflex Direct LDL   TSH   CBC   Screening mammogram for breast cancer       Relevant Orders   MM 3D SCREEN BREAST BILATERAL   Screen for  colon cancer       Relevant Orders   Ambulatory referral to Gastroenterology       Overdue for mammogram and Pap smear and colon cancer screening.  We will get those orders placed.  She wants to make sure it is in network since she also has [employer].  Return in about 6 months (around 10/11/2022) for Hypertension.    Beatrice Lecher, MD

## 2022-04-10 NOTE — Progress Notes (Signed)
Spoke w/pt about Colonoscopy she is going to contact her insurance to see what provider is in network.

## 2022-04-11 LAB — CBC
HCT: 39.2 % (ref 35.0–45.0)
Hemoglobin: 13.1 g/dL (ref 11.7–15.5)
MCH: 30.3 pg (ref 27.0–33.0)
MCHC: 33.4 g/dL (ref 32.0–36.0)
MCV: 90.5 fL (ref 80.0–100.0)
MPV: 10.5 fL (ref 7.5–12.5)
Platelets: 271 10*3/uL (ref 140–400)
RBC: 4.33 10*6/uL (ref 3.80–5.10)
RDW: 12.1 % (ref 11.0–15.0)
WBC: 5.6 10*3/uL (ref 3.8–10.8)

## 2022-04-11 LAB — COMPLETE METABOLIC PANEL WITH GFR
AG Ratio: 1.4 (calc) (ref 1.0–2.5)
ALT: 15 U/L (ref 6–29)
AST: 18 U/L (ref 10–35)
Albumin: 3.8 g/dL (ref 3.6–5.1)
Alkaline phosphatase (APISO): 78 U/L (ref 37–153)
BUN: 22 mg/dL (ref 7–25)
CO2: 27 mmol/L (ref 20–32)
Calcium: 9.2 mg/dL (ref 8.6–10.4)
Chloride: 109 mmol/L (ref 98–110)
Creat: 0.8 mg/dL (ref 0.50–1.05)
Globulin: 2.8 g/dL (calc) (ref 1.9–3.7)
Glucose, Bld: 86 mg/dL (ref 65–99)
Potassium: 4.8 mmol/L (ref 3.5–5.3)
Sodium: 141 mmol/L (ref 135–146)
Total Bilirubin: 0.3 mg/dL (ref 0.2–1.2)
Total Protein: 6.6 g/dL (ref 6.1–8.1)
eGFR: 84 mL/min/{1.73_m2} (ref >=60)

## 2022-04-11 LAB — LIPID PANEL W/REFLEX DIRECT LDL
Cholesterol: 160 mg/dL (ref <200)
HDL: 69 mg/dL (ref >=50)
LDL Cholesterol (Calc): 77 mg/dL (calc)
Non-HDL Cholesterol (Calc): 91 mg/dL (calc) (ref <130)
Total CHOL/HDL Ratio: 2.3 (calc) (ref <5.0)
Triglycerides: 64 mg/dL (ref <150)

## 2022-04-11 LAB — TSH: TSH: 1.98 mIU/L (ref 0.40–4.50)

## 2022-04-11 NOTE — Progress Notes (Signed)
Hi Jaymi, all of your labs look perfect!

## 2022-04-19 ENCOUNTER — Ambulatory Visit: Payer: BLUE CROSS/BLUE SHIELD

## 2022-10-09 ENCOUNTER — Ambulatory Visit (INDEPENDENT_AMBULATORY_CARE_PROVIDER_SITE_OTHER): Payer: BLUE CROSS/BLUE SHIELD | Admitting: Family Medicine

## 2022-10-09 DIAGNOSIS — I10 Essential (primary) hypertension: Secondary | ICD-10-CM

## 2022-10-09 DIAGNOSIS — R7301 Impaired fasting glucose: Secondary | ICD-10-CM

## 2022-10-09 NOTE — Progress Notes (Deleted)
   Established Patient Office Visit  Subjective   Patient ID: Amanda Andrews, female    DOB: 05-12-1961  Age: 61 y.o. MRN: [id number]  No chief complaint on file.   HPI  Hypertension- Pt denies chest pain, SOB, dizziness, or heart palpitations.  Taking meds as directed w/o problems.  Denies medication side effects.     {History (Optional):23778}  ROS    Objective:     There were no vitals taken for this visit. {Vitals History (Optional):23777}  Physical Exam Vitals and nursing note reviewed.  Constitutional:      Appearance: She is well-developed.  HENT:     Head: Normocephalic and atraumatic.  Cardiovascular:     Rate and Rhythm: Normal rate and regular rhythm.     Heart sounds: Normal heart sounds.  Pulmonary:     Effort: Pulmonary effort is normal.     Breath sounds: Normal breath sounds.  Skin:    General: Skin is warm and dry.  Neurological:     Mental Status: She is alert and oriented to person, place, and time.  Psychiatric:        Behavior: Behavior normal.    No results found for any visits on 10/09/22.  {Labs (Optional):23779}  The 10-year ASCVD risk score (Arnett DK, et al., 2019) is: 3.9%    Assessment & Plan:   Problem List Items Addressed This Visit       Cardiovascular and Mediastinum   HYPERTENSION, BENIGN ESSENTIAL - Primary    No follow-ups on file.    Nani Gasser, MD

## 2022-10-11 NOTE — Progress Notes (Signed)
No show

## 2023-03-02 ENCOUNTER — Other Ambulatory Visit: Payer: Self-pay | Admitting: Family Medicine

## 2023-03-02 ENCOUNTER — Telehealth: Payer: Self-pay | Admitting: Family Medicine

## 2023-03-02 DIAGNOSIS — Z1231 Encounter for screening mammogram for malignant neoplasm of breast: Secondary | ICD-10-CM

## 2023-03-04 NOTE — Telephone Encounter (Signed)
Note is blank 

## 2023-03-07 ENCOUNTER — Ambulatory Visit (INDEPENDENT_AMBULATORY_CARE_PROVIDER_SITE_OTHER): Payer: BLUE CROSS/BLUE SHIELD

## 2023-03-07 ENCOUNTER — Telehealth: Payer: Self-pay

## 2023-03-07 DIAGNOSIS — Z1231 Encounter for screening mammogram for malignant neoplasm of breast: Secondary | ICD-10-CM | POA: Diagnosis not present

## 2023-03-07 NOTE — Telephone Encounter (Signed)
Pls schedule her with Dr. Karie Schwalbe

## 2023-03-07 NOTE — Telephone Encounter (Signed)
Patient came into office to see what her choices could be concerning her Right knee, patient states that she tore her meniscus 6-7 years ago and underneath her knee cap it is painful, patient would like to know if she could get a cortisone shot, please advise, thanks.

## 2023-03-07 NOTE — Telephone Encounter (Signed)
She will need to schedule an appointment to get accessed for this to see what would be the best course of treatment.

## 2023-03-08 ENCOUNTER — Ambulatory Visit (INDEPENDENT_AMBULATORY_CARE_PROVIDER_SITE_OTHER): Payer: BLUE CROSS/BLUE SHIELD | Admitting: Sports Medicine

## 2023-03-08 ENCOUNTER — Ambulatory Visit (INDEPENDENT_AMBULATORY_CARE_PROVIDER_SITE_OTHER): Payer: BLUE CROSS/BLUE SHIELD

## 2023-03-08 DIAGNOSIS — M25561 Pain in right knee: Secondary | ICD-10-CM | POA: Diagnosis not present

## 2023-03-08 DIAGNOSIS — Z09 Encounter for follow-up examination after completed treatment for conditions other than malignant neoplasm: Secondary | ICD-10-CM | POA: Diagnosis not present

## 2023-03-08 DIAGNOSIS — G8929 Other chronic pain: Secondary | ICD-10-CM | POA: Diagnosis not present

## 2023-03-08 DIAGNOSIS — S76312S Strain of muscle, fascia and tendon of the posterior muscle group at thigh level, left thigh, sequela: Secondary | ICD-10-CM | POA: Diagnosis not present

## 2023-03-08 DIAGNOSIS — M1711 Unilateral primary osteoarthritis, right knee: Secondary | ICD-10-CM | POA: Insufficient documentation

## 2023-03-08 MED ORDER — MELOXICAM 15 MG PO TABS: ORAL_TABLET | ORAL | 3 refills | Status: AC

## 2023-03-08 NOTE — Patient Instructions (Signed)
Initial Hamstring Rehab Protocol Hamstring curls: Start with 3 sets of 15 (no weight); Progress by 5 reps every 3 days until you reach 3 sets of 30; After 3 days at 3 sets of 30, add 2lb ankle weight at 3 sets of 10; Increase every 5 days by 5 reps. You may add 2lbs ankle weight once weekly. Hamstring swings- swing leg backwards and curl at the end of the swing. Follow same schedule as above. Hamstring running lunges- running lunge position means no more than 45 degrees of knee flexion and running motion. Follow same schedule as above. 

## 2023-03-08 NOTE — Progress Notes (Signed)
    Procedures performed today:    None.  Independent interpretation of notes and tests performed by another provider:   None.  Brief History, Exam, Impression, and Recommendations:    Chronic pain of right knee Long history of pain right knee medial joint line with locking, catching, buckling. She has about 30 degrees of flexion lag on the right, positive Murray sign. Adding x-rays of both knees, meloxicam, home conditioning, return in 2 weeks, injection if not better.  Hamstring strain, left, sequela Approximately 3 weeks post hamstring strain on the left with weakness to resisted knee flexion. Improving therapy, adding hamstring conditioning, return as needed for this.    ____________________________________________ Ihor Austin. Benjamin Stain, M.D., ABFM., CAQSM., AME. Primary Care and Sports Medicine  [facility]  Adjunct Professor of Family Medicine  [employer] [facility] [employer]  Restaurant manager, fast food

## 2023-03-08 NOTE — Assessment & Plan Note (Signed)
Long history of pain right knee medial joint line with locking, catching, buckling. She has about 30 degrees of flexion lag on the right, positive Murray sign. Adding x-rays of both knees, meloxicam, home conditioning, return in 2 weeks, injection if not better.

## 2023-03-08 NOTE — Assessment & Plan Note (Signed)
Approximately 3 weeks post hamstring strain on the left with weakness to resisted knee flexion. Improving therapy, adding hamstring conditioning, return as needed for this.

## 2023-03-09 NOTE — Progress Notes (Signed)
Please call patient. Normal mammogram.  Repeat in 1 year.  

## 2023-03-13 ENCOUNTER — Encounter: Payer: BLUE CROSS/BLUE SHIELD | Admitting: Sports Medicine

## 2023-03-26 ENCOUNTER — Ambulatory Visit (INDEPENDENT_AMBULATORY_CARE_PROVIDER_SITE_OTHER): Payer: BLUE CROSS/BLUE SHIELD

## 2023-03-26 ENCOUNTER — Ambulatory Visit (INDEPENDENT_AMBULATORY_CARE_PROVIDER_SITE_OTHER): Payer: BLUE CROSS/BLUE SHIELD | Admitting: Sports Medicine

## 2023-03-26 DIAGNOSIS — M47812 Spondylosis without myelopathy or radiculopathy, cervical region: Secondary | ICD-10-CM | POA: Insufficient documentation

## 2023-03-26 DIAGNOSIS — M1711 Unilateral primary osteoarthritis, right knee: Secondary | ICD-10-CM

## 2023-03-26 DIAGNOSIS — S76312S Strain of muscle, fascia and tendon of the posterior muscle group at thigh level, left thigh, sequela: Secondary | ICD-10-CM

## 2023-03-26 NOTE — Assessment & Plan Note (Signed)
Very pleasant 62 year old female, chronic right knee pain, x-rays did show osteoarthritis, failed conservative treatment, we injected her knee today, we discussed Visco, return to see me in 6 weeks.

## 2023-03-26 NOTE — Progress Notes (Signed)
    Procedures performed today:    Procedure: Real-time Ultrasound Guided injection of the right knee Device: Samsung HS60  Verbal informed consent obtained.  Time-out conducted.  Noted no overlying erythema, induration, or other signs of local infection.  Skin prepped in a sterile fashion.  Local anesthesia: Topical Ethyl chloride.  With sterile technique and under real time ultrasound guidance: Trace effusion noted, 1 cc Kenalog 40, 2 cc lidocaine, 2 cc bupivacaine injected easily Completed without difficulty  Advised to call if fevers/chills, erythema, induration, drainage, or persistent bleeding.  Images permanently stored and available for review in PACS.  Impression: Technically successful ultrasound guided injection.  Independent interpretation of notes and tests performed by another provider:   None.  Brief History, Exam, Impression, and Recommendations:    Primary osteoarthritis of right knee Very pleasant 62 year old female, chronic right knee pain, x-rays did show osteoarthritis, failed conservative treatment, we injected her knee today, we discussed Visco, return to see me in 6 weeks.  Hamstring strain, left, sequela Resolved with conservative treatment  Cervical spondylosis Did bring up neck pain, right-sided, nothing overtly radicular, we will start conservatively with x-rays and home physical therapy, if insufficient improvement after 6 weeks we will proceed with MRI for interventional planning.    ____________________________________________ Ihor Austin. Benjamin Stain, M.D., ABFM., CAQSM., AME. Primary Care and Sports Medicine [facility]  Adjunct Professor of Family Medicine  [place] [facility] [employer]  Restaurant manager, fast food

## 2023-03-26 NOTE — Assessment & Plan Note (Signed)
Resolved with conservative treatment. 

## 2023-03-26 NOTE — Assessment & Plan Note (Signed)
Did bring up neck pain, right-sided, nothing overtly radicular, we will start conservatively with x-rays and home physical therapy, if insufficient improvement after 6 weeks we will proceed with MRI for interventional planning.

## 2023-05-07 ENCOUNTER — Ambulatory Visit: Payer: BLUE CROSS/BLUE SHIELD | Admitting: Sports Medicine

## 2023-05-28 ENCOUNTER — Other Ambulatory Visit: Payer: Self-pay | Admitting: Family Medicine

## 2023-05-28 DIAGNOSIS — I10 Essential (primary) hypertension: Secondary | ICD-10-CM

## 2023-07-13 ENCOUNTER — Telehealth: Payer: Self-pay | Admitting: Family Medicine

## 2023-07-13 ENCOUNTER — Other Ambulatory Visit: Payer: Self-pay | Admitting: Family Medicine

## 2023-07-13 DIAGNOSIS — I10 Essential (primary) hypertension: Secondary | ICD-10-CM

## 2023-07-13 NOTE — Telephone Encounter (Signed)
Called patient to schedule her OV and labs she was driving and told me she would call back and schedule her appointment acm

## 2023-07-13 NOTE — Telephone Encounter (Signed)
Please call pt and advise her that she is due for an OV and labs for refills on BP medication.

## 2023-07-13 NOTE — Telephone Encounter (Signed)
I sent over a 30 day supply TODAY @ 1251 pm . She will still need to schedule an appointment

## 2023-07-13 NOTE — Telephone Encounter (Signed)
Patient called she is going out of town and is requesting a few lisinopril-hydrocholorthiazide 20-25mg  tablet she is out please advise [facility]  [phone]

## 2023-08-24 ENCOUNTER — Other Ambulatory Visit: Payer: Self-pay | Admitting: Family Medicine

## 2023-08-24 DIAGNOSIS — Z1211 Encounter for screening for malignant neoplasm of colon: Secondary | ICD-10-CM

## 2023-08-24 DIAGNOSIS — Z1212 Encounter for screening for malignant neoplasm of rectum: Secondary | ICD-10-CM

## 2024-07-22 ENCOUNTER — Encounter: Payer: Self-pay | Admitting: Sports Medicine
